# Patient Record
Sex: Male | Born: 1990 | Race: White | Hispanic: No | Marital: Married | Smoking: Never smoker
Health system: Southern US, Community
[De-identification: ages and names within clinical notes are randomized; demographics above are authoritative.]

## PROBLEM LIST (undated history)

## (undated) DIAGNOSIS — K4090 Unilateral inguinal hernia, without obstruction or gangrene, not specified as recurrent: Secondary | ICD-10-CM

## (undated) DIAGNOSIS — N433 Hydrocele, unspecified: Secondary | ICD-10-CM

## (undated) HISTORY — DX: Hydrocele, unspecified: N43.3

## (undated) HISTORY — DX: Unilateral inguinal hernia, without obstruction or gangrene, not specified as recurrent: K40.90

---
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
## 2013-03-01 IMAGING — CT CT HEAD WITHOUT CONTRAST
2 series · 16 of 30 positions shown, 20 images · non-contrast
Comparison: none

REASON FOR EXAM: syncope
COMMENTS:

PROCEDURE:     CT  - CT HEAD WITHOUT CONTRAST  - October 19, 2011  [DATE]
RESULT:     History: Syncope.
Comparison Study: No prior.

[Series 2: without · axial · non-contrast · 0.43mm/px · z∈[-176,-56]mm · 13 of 29 slices shown, 17 images]
[im 3/29  brain]
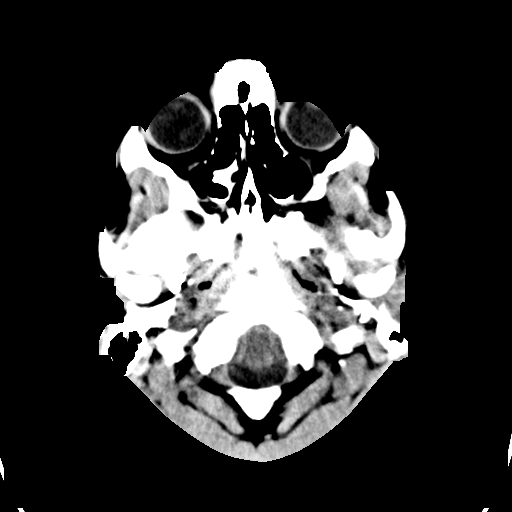
[im 3/29  bone]
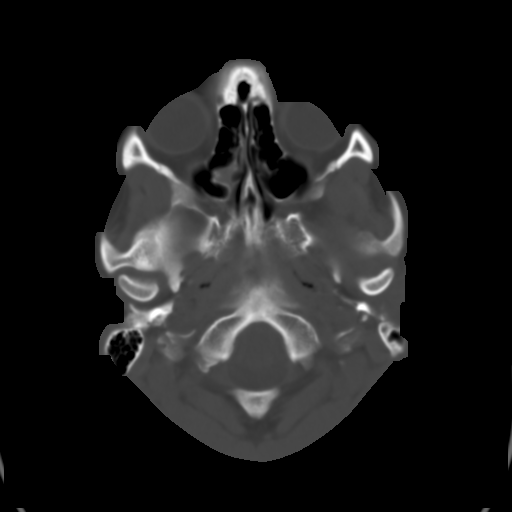
[im 5/29  brain]
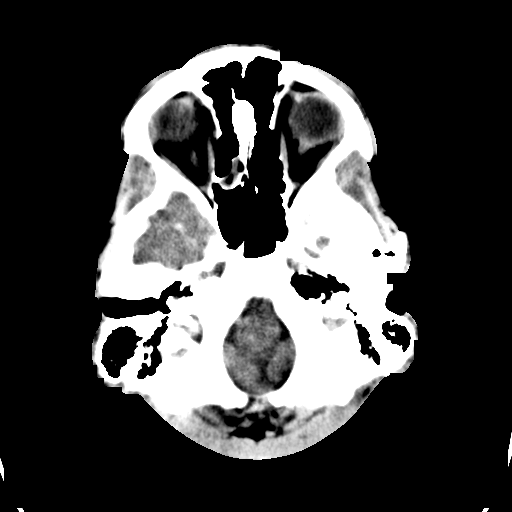
[im 7/29  brain]
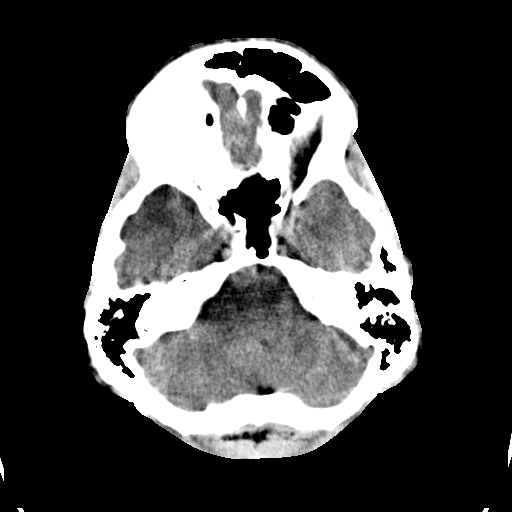
[im 9/29  brain]
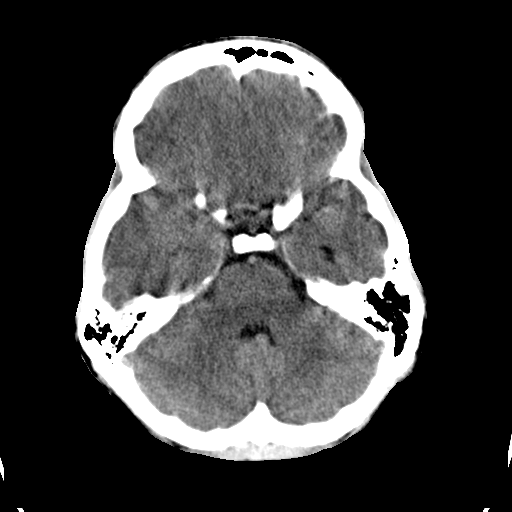
[im 11/29  brain]
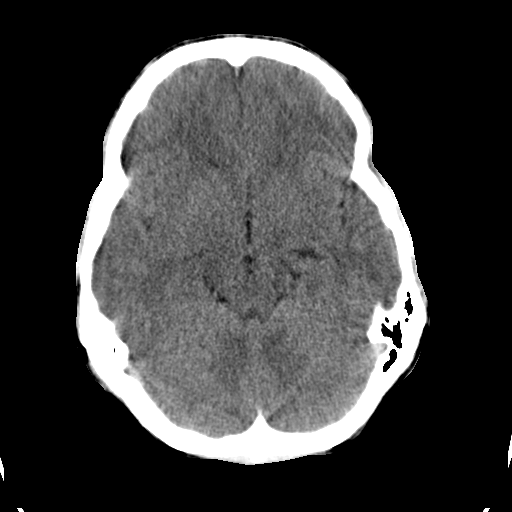
[im 11/29  bone]
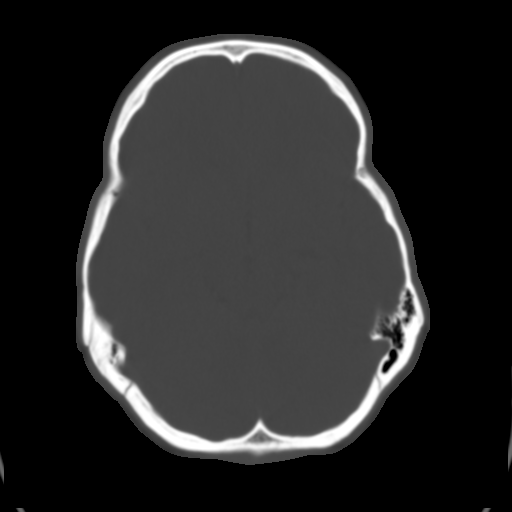
[im 13/29  brain]
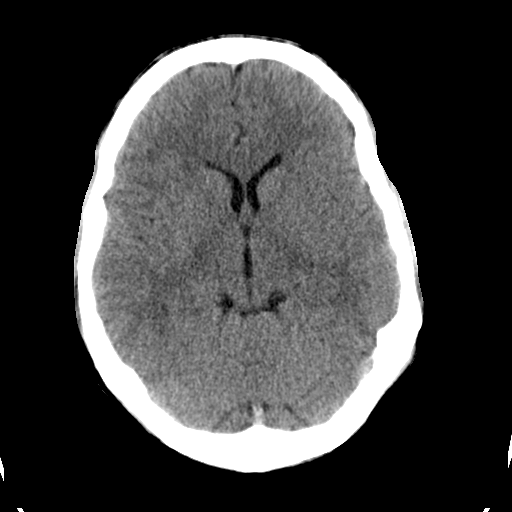
[im 15/29  brain]
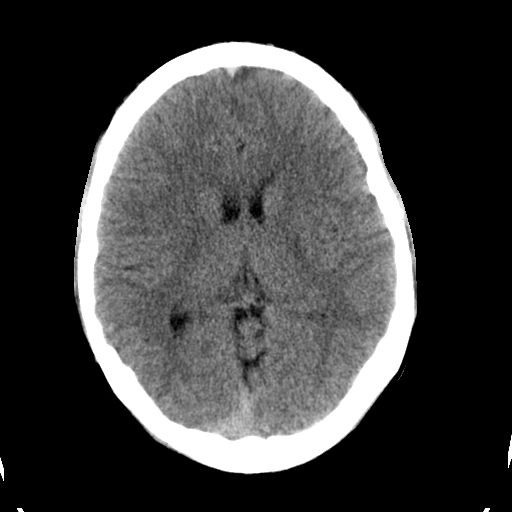
[im 17/29  brain]
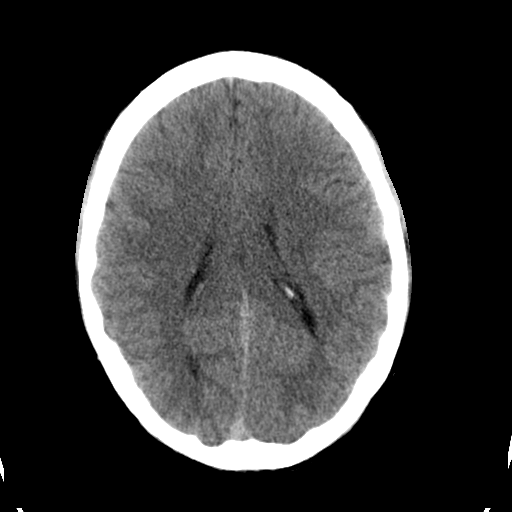
[im 19/29  brain]
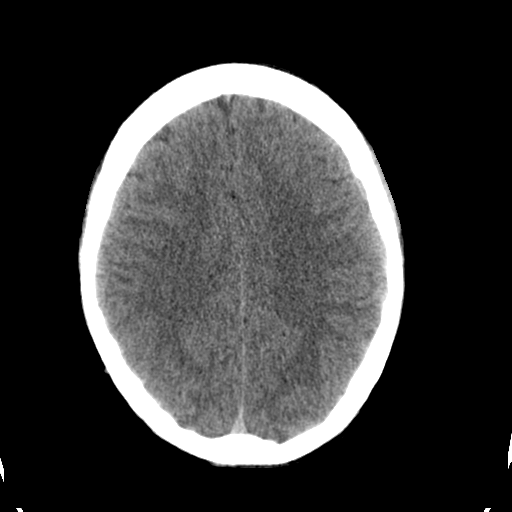
[im 19/29  bone]
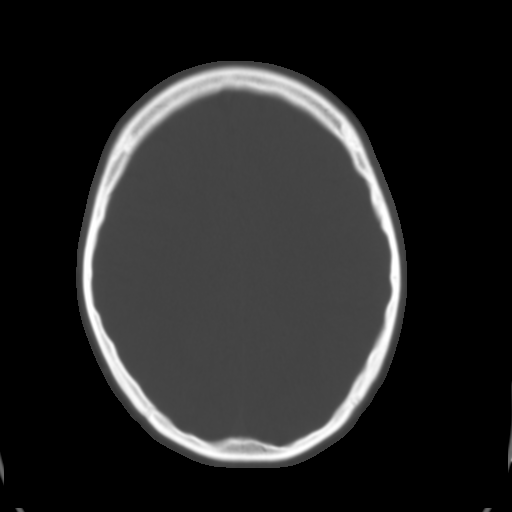
[im 21/29  brain]
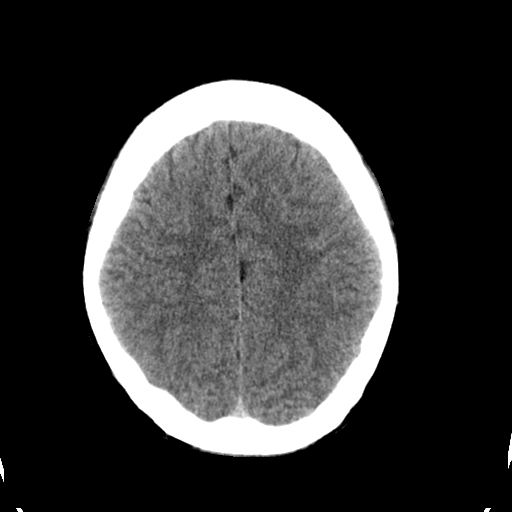
[im 23/29  brain]
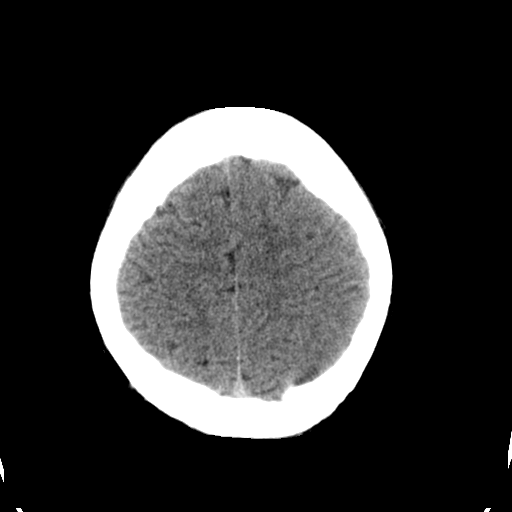
[im 25/29  brain]
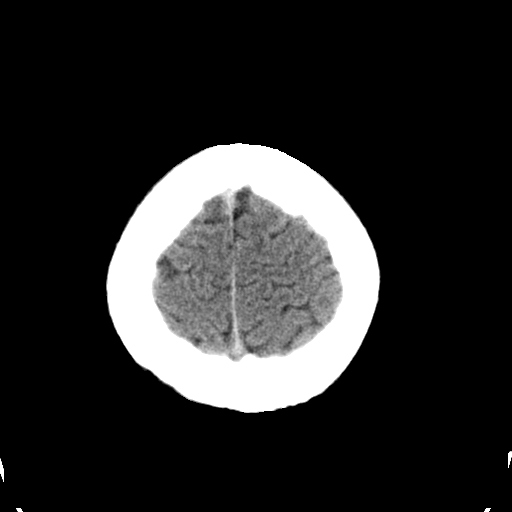
[im 27/29  brain]
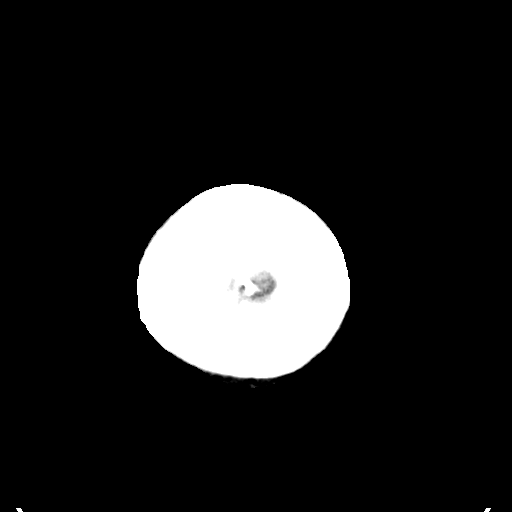
[im 27/29  bone]
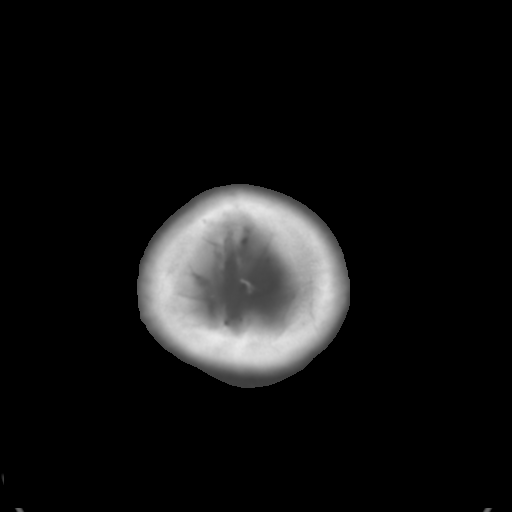

[Series 3: bone · axial · 0.43mm/px · z∈[-176,-136]mm · 3 of 29 slices shown]
[im 3/29  bone]
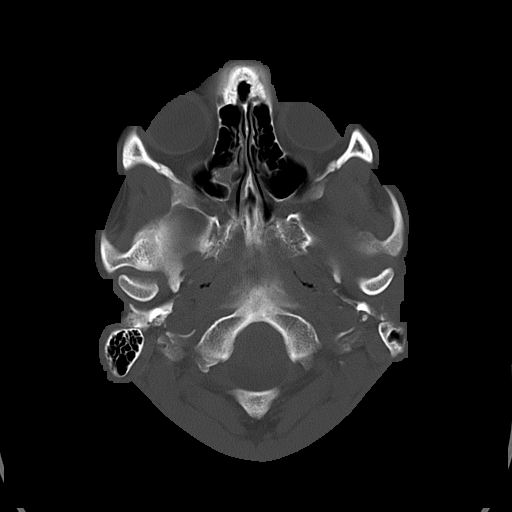
[im 7/29  bone]
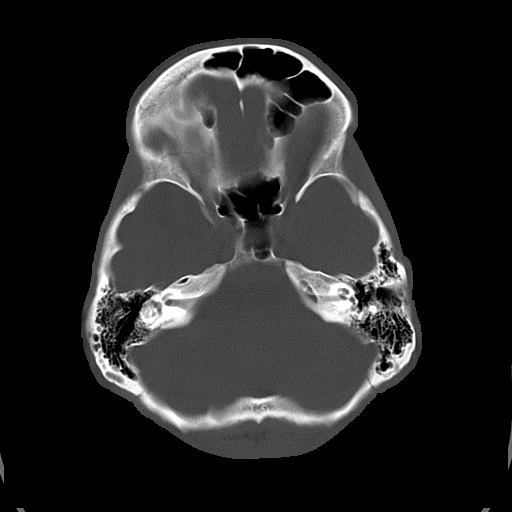
[im 11/29  bone]
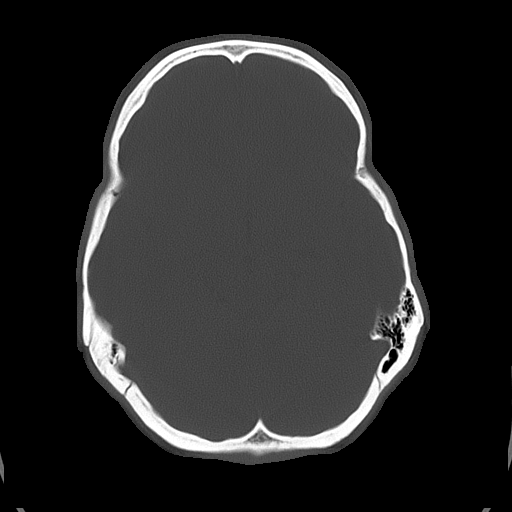

[16 of 30 positions shown; findings below may reference images not displayed]

FINDINGS: No mass. No hydrocephalus. No hemorrhage. And no acute bony
abnormality. There is mild thickening noted in the ethmoid sinuses.
IMPRESSION: No acute abnormality

## 2013-03-01 IMAGING — CR CERVICAL SPINE - COMPLETE 4+ VIEW
1 series · 7 of 7 positions shown · non-contrast
Comparison: None

REASON FOR EXAM: s/p fall
COMMENTS:

PROCEDURE:     DXR - DXR CERVICAL SPINE COMPLETE  - October 19, 2011  [DATE]
RESULT:     History: Cervical pain

[Series 1: w cervical spine lat · 0.14mm/px · 7 of 7 slices shown]
[im 1/7]
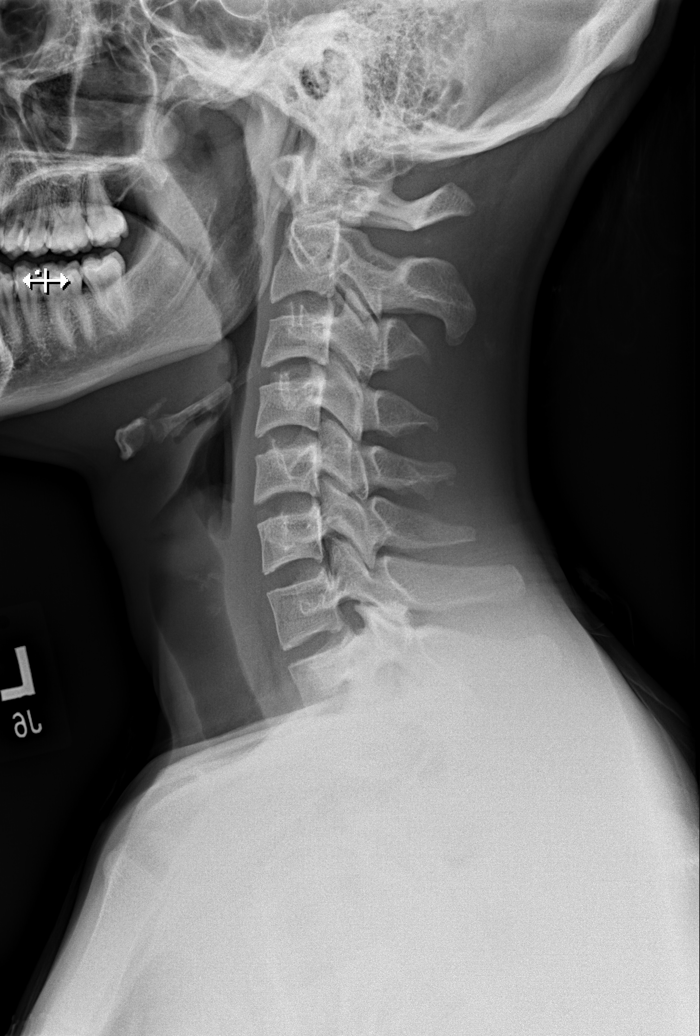
[im 2/7]
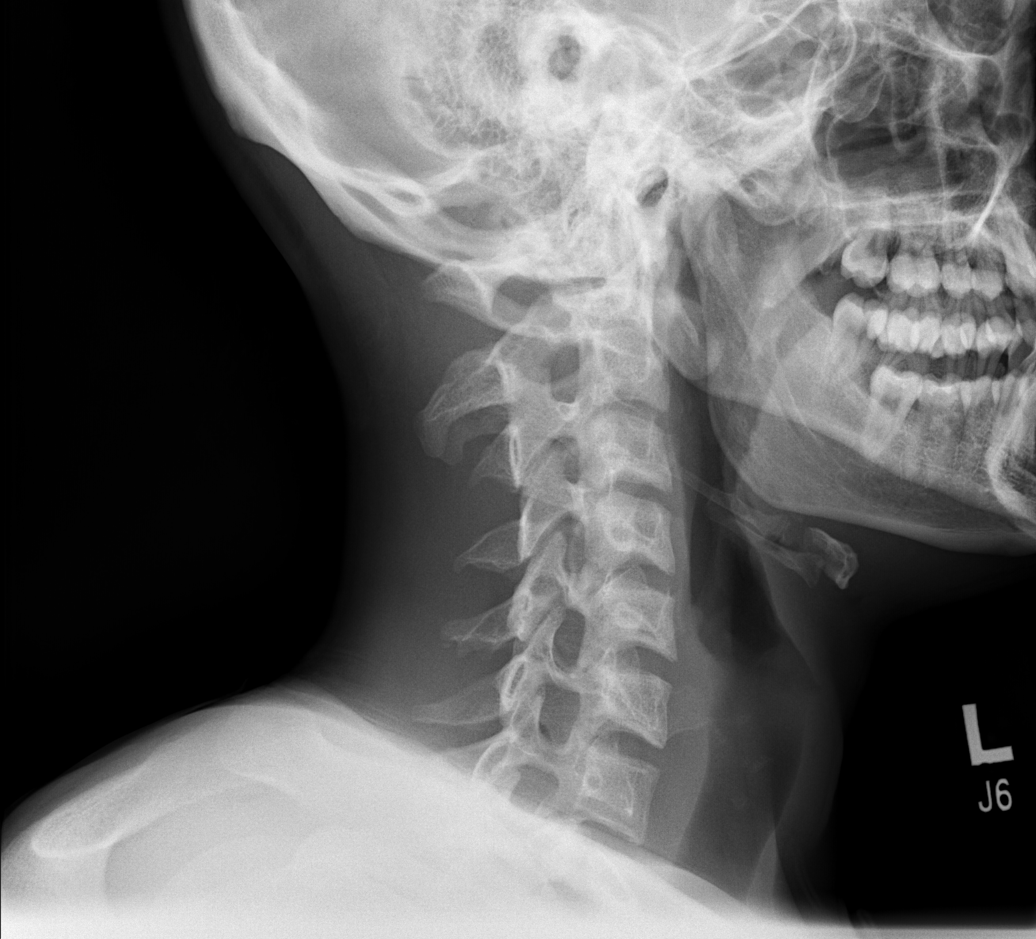
[im 3/7]
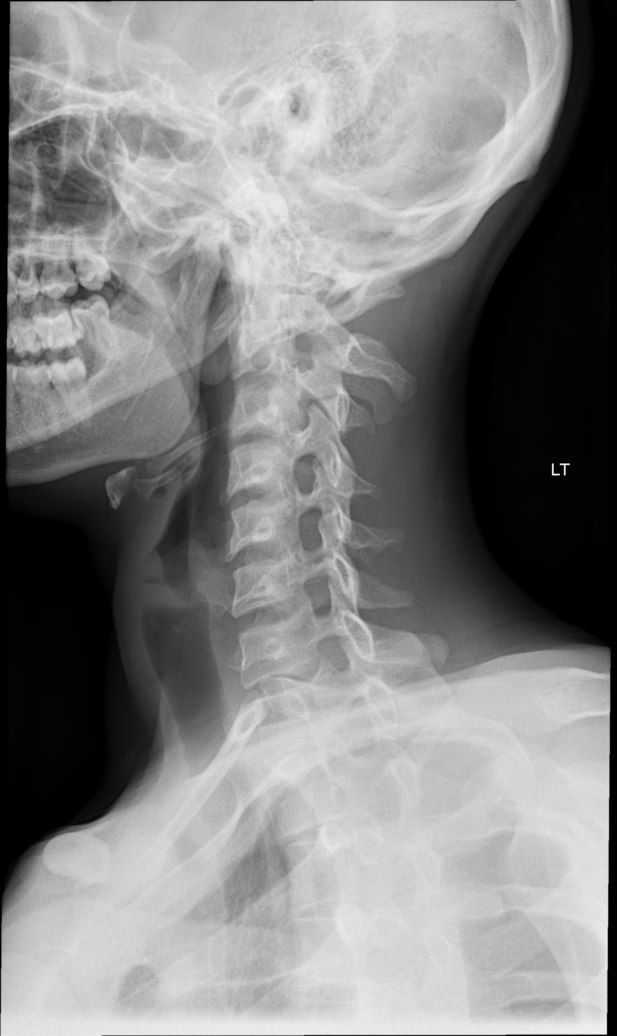
[im 4/7]
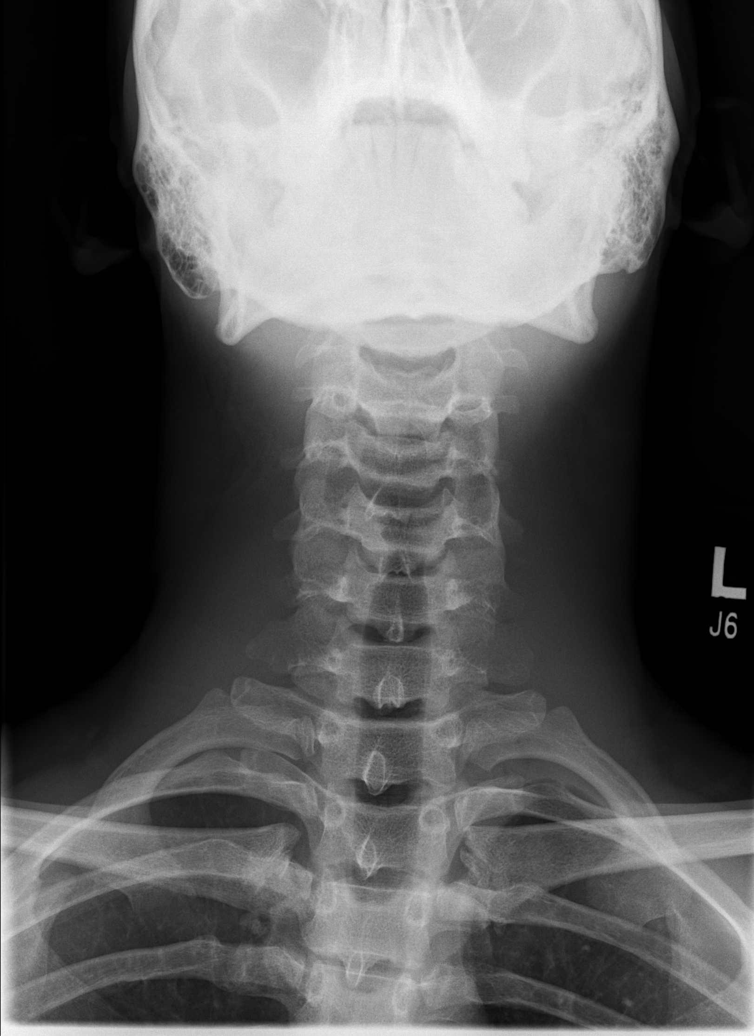
[im 5/7]
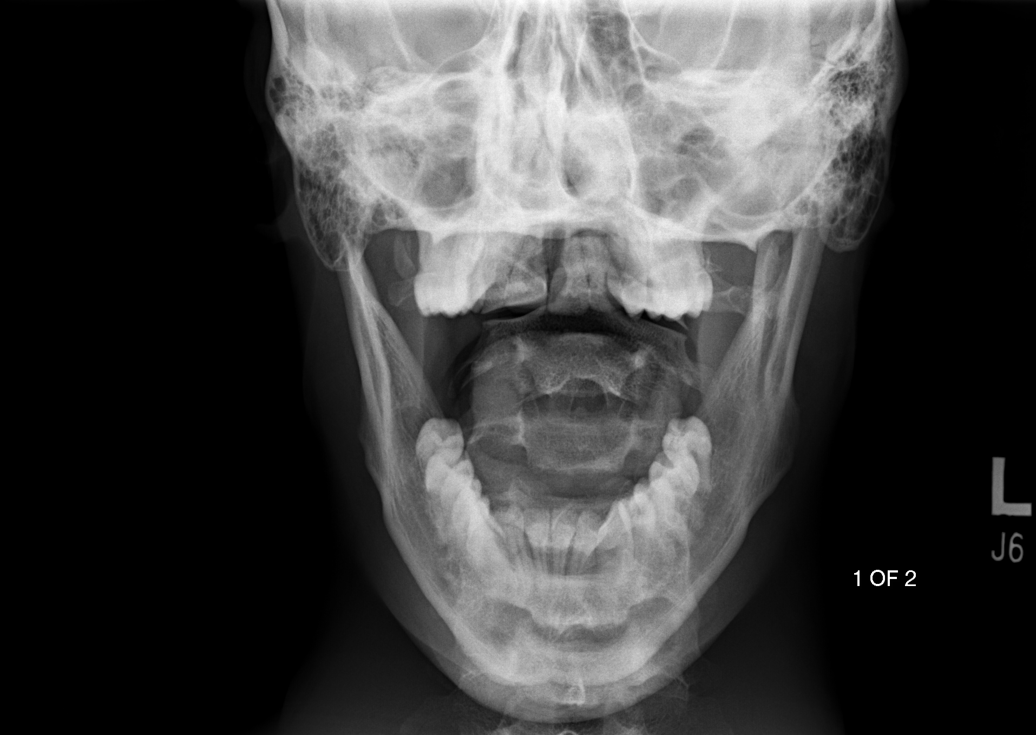
[im 6/7]
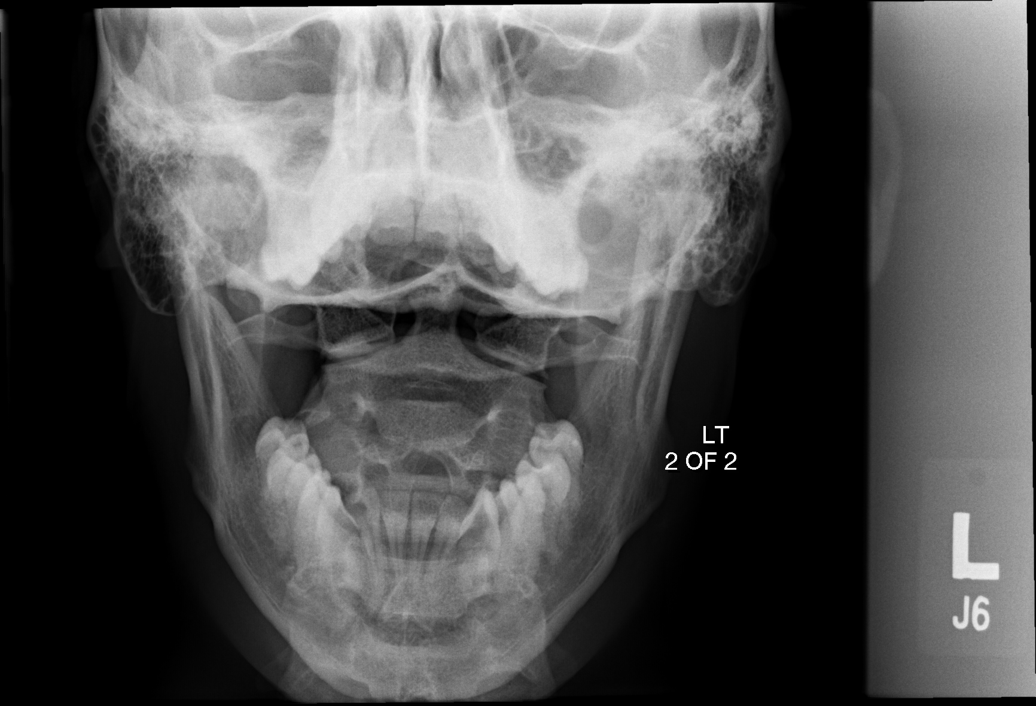
[im 7/7]
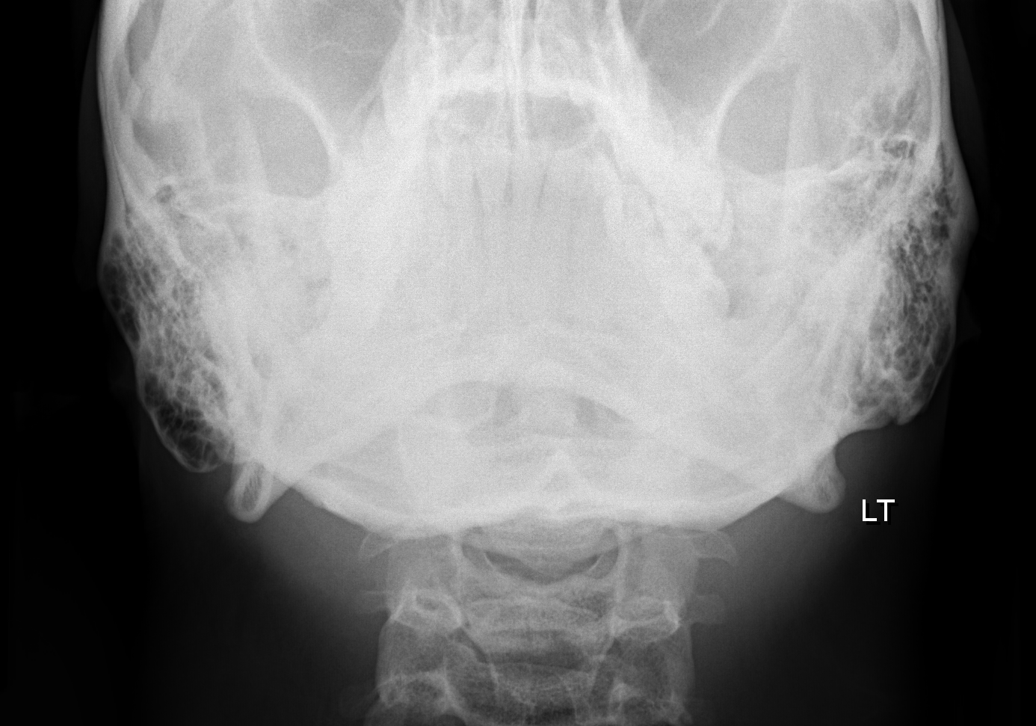

[7 of 7 positions shown; findings below may reference images not displayed]

FINDINGS: AP, lateral, bilateral oblique and odontoid views of the cervical spine are
provided.

The cervical spine is visualized to the level of T1.

The vertebral body heights are maintained. The alignment is normal. The
prevertebral soft tissues are normal. There is no acute fracture or static
listhesis. The disc spaces are maintained. Bilateral neural foramina are
patent.
IMPRESSION: No acute osseous injury of the cervical spine.

[REDACTED]

## 2017-04-08 ENCOUNTER — Encounter: Payer: Self-pay | Admitting: Urology

## 2017-04-08 ENCOUNTER — Ambulatory Visit: Payer: BC Managed Care – PPO | Admitting: Urology

## 2017-04-08 VITALS — BP 169/70 | HR 76 | Ht 71.0 in | Wt 160.0 lb

## 2017-04-08 DIAGNOSIS — N433 Hydrocele, unspecified: Secondary | ICD-10-CM | POA: Diagnosis not present

## 2017-04-08 NOTE — Progress Notes (Signed)
04/08/2017 6:09 PM   Ezequiel Kayser 15-Sep-1990 [id number]  Referring provider: No referring provider defined for this encounter.  Chief Complaint  Patient presents with  . Groin Swelling    New Patient    HPI: [name] is a 27 year old male who presents for evaluation of the right hemiscrotal swelling.  He underwent a right inguinal hernia repair at [facility] in 2009.  Shortly after that surgery he noted enlargement of his right hemiscrotum.  The size has been stable for the last 5 years.  He has mild discomfort on occasion when exercising, with certain changes in position and with tighter clothing.  There is no day-to-day size variation.  He has no voiding complaints.   PMH: Past Medical History:  Diagnosis Date  . Hydrocele   . Inguinal hernia     Surgical History: Past Surgical History:  Procedure Laterality Date  . INGUINAL HERNIA REPAIR Right 2009    Home Medications:  Allergies as of 04/08/2017   No Known Allergies     Medication List    as of 04/08/2017  6:09 PM   You have not been prescribed any medications.     Allergies: No Known Allergies  Family History: Family History  Problem Relation Age of Onset  . Bladder Cancer Neg Hx   . Prostate cancer Neg Hx   . Kidney cancer Neg Hx     Social History:  reports that  has never smoked. he has never used smokeless tobacco. He reports that he drinks alcohol. He reports that he does not use drugs.  ROS: UROLOGY Frequent Urination?: No Hard to postpone urination?: No Burning/pain with urination?: No Get up at night to urinate?: No Leakage of urine?: No Urine stream starts and stops?: No Trouble starting stream?: No Do you have to strain to urinate?: No Blood in urine?: No Urinary tract infection?: No Sexually transmitted disease?: No Injury to kidneys or bladder?: No Painful intercourse?: No Weak stream?: No Erection problems?: No Penile pain?: No  Gastrointestinal Nausea?: No Vomiting?:  No Indigestion/heartburn?: No Diarrhea?: No Constipation?: No  Constitutional Fever: No Night sweats?: No Weight loss?: No Fatigue?: No  Skin Skin rash/lesions?: No Itching?: No  Eyes Blurred vision?: No Double vision?: No  Ears/Nose/Throat Sore throat?: No Sinus problems?: No  Hematologic/Lymphatic Swollen glands?: No Easy bruising?: No  Cardiovascular Leg swelling?: No Chest pain?: No  Respiratory Cough?: No Shortness of breath?: No  Endocrine Excessive thirst?: No  Musculoskeletal Back pain?: No Joint pain?: No  Neurological Headaches?: No Dizziness?: No  Psychologic Depression?: No Anxiety?: No  Physical Exam: BP (!) 169/70   Pulse 76   Ht 5\' 11"  (1.803 m)   Wt 160 lb (72.6 kg)   BMI 22.32 kg/m   Constitutional:  Alert and oriented, No acute distress. HEENT: [place] AT, moist mucus membranes.  Trachea midline, no masses. Cardiovascular: No clubbing, cyanosis, or edema. Respiratory: Normal respiratory effort, no increased work of breathing. GI: Abdomen is soft, nontender, nondistended, no abdominal masses GU: No CVA tenderness.  Transilluminating right hemiscrotal mass measuring approximately 8 cm.  No tenderness.  The right testis is not palpable.  Left testis palpably normal.  Penis circumcised without lesions. Skin: No rashes, bruises or suspicious lesions. Lymph: No cervical or inguinal adenopathy. Neurologic: Grossly intact, no focal deficits, moving all 4 extremities. Psychiatric: Normal mood and affect.  Laboratory Data: Lab Results  Component Value Date   WBC 9.3 10/19/2011   HGB 15.8 10/19/2011   HCT 44.8 10/19/2011  MCV 91 10/19/2011   PLT 190 10/19/2011    Lab Results  Component Value Date   CREATININE 1.08 10/19/2011     Assessment & Plan:    1. Hydrocele, unspecified hydrocele type Management options were discussed including observation, hydrocelectomy and aspiration/sclerotherapy.  He has elected observation for now.   Since his right testis is not palpable have recommended a scrotal ultrasound which will be scheduled.  He will be contacted with the results and may follow-up as needed for any significant change in size or development of symptoms.  - US Scrotum; Future    Riki AltesScott C Lister Brizzi, MD  [facility] [address], [address] [place], [place] [address] [phone]

## 2023-06-24 NOTE — Progress Notes (Signed)
 Virtual Primary Care: Outpatient visit note   Patient location at the time of the visit: [place]   I have verified the patient's location, and I am licensed to practice in that state. Audio and video technology were used to conduct this virtual visit. Patient (or parent/guardian as applicable) consented to virtual care.   Patient is: not a minor   Subjective:    Jordan Moreno is a 33 y.o. male here for an acute sick  patient visit.   Here today for evaluation of sinus pressure/pain and congestion.  Patient reports symptoms started about 2 weeks ago and has continued to worsen with increasing sinus pressure/pain, yellow mucous, tooth pain and headache.  He notes he has taken Claritin and Benadryl OTC but with minimal response.  He does report cough and sore throat last week as well.    Review of Systems  Constitutional:  Positive for fatigue. Negative for chills and fever.  HENT:  Positive for congestion, postnasal drip, rhinorrhea, sinus pressure and sinus pain. Negative for ear pain and sore throat.   Respiratory:  Negative for cough, chest tightness, shortness of breath and wheezing.   Cardiovascular:  Negative for chest pain and palpitations.  Neurological:  Positive for headaches.    Current Outpatient Medications  Medication Sig Dispense Refill  . amoxicillin (AMOXIL) 875 MG tablet Take 1 tablet (875 mg total) by mouth 2 (two) times a day for 10 days 20 tablet 0   No current facility-administered medications for this visit.    No Known Allergies  History reviewed. No pertinent past medical history.  History reviewed. No pertinent family history.  Past Surgical History:  Procedure Laterality Date  . HERNIA REPAIR      Social History   Socioeconomic History  . Marital status: Not on file    Spouse name: Not on file  . Number of children: Not on file  . Years of education: Not on file  . Highest education level: Not on file  Occupational History  . Not on file   Tobacco Use  . Smoking status: Never  . Smokeless tobacco: Never  Substance and Sexual Activity  . Alcohol use: Not on file  . Drug use: Not on file  . Sexual activity: Not on file  Other Topics Concern  . Not on file  Social History Narrative  . Not on file   Social Drivers of Health   Financial Resource Strain: Not on file  Food Insecurity: Not on file  Transportation Needs: Not on file  Physical Activity: Not on file  Stress: Not on file  Social Connections: Not on file  Intimate Partner Violence: Not on file  Housing Stability: Not on file    Objective:    Physical exam: VS: Ht 5' 11 (1.803 m)   Wt 165 lb (74.8 kg)   BMI 23.01 kg/m  No LMP for male patient. All vital signs are self-reported by the patient.  Physical Exam Vitals and nursing note reviewed.  Constitutional:      General: He is not in acute distress.    Appearance: Normal appearance. He is ill-appearing. He is not toxic-appearing or diaphoretic.  HENT:     Nose: Nasal tenderness and congestion present.     Right Sinus: Maxillary sinus tenderness and frontal sinus tenderness present.     Left Sinus: Maxillary sinus tenderness and frontal sinus tenderness present.     Mouth/Throat:     Lips: Pink.  Pulmonary:     Effort: Pulmonary  effort is normal. No respiratory distress.     Breath sounds: No wheezing.  Neurological:     General: No focal deficit present.     Mental Status: He is alert and oriented to person, place, and time.     Cranial Nerves: No cranial nerve deficit.  Psychiatric:        Mood and Affect: Mood normal.        Behavior: Behavior normal. Behavior is cooperative.        Thought Content: Thought content normal.        Judgment: Judgment normal.      Assessment & Plan:   Diagnoses and all orders for this visit:  Acute non-recurrent pansinusitis -     amoxicillin (AMOXIL) 875 MG tablet; Take 1 tablet (875 mg total) by mouth 2 (two) times a day for 10 days    Assessments 1 Plan Comments:   See HPI notes, Clinical findings evident for acute bacterial pansinusitis. The patient is a good candidate for outpatient management with oral antibiotic and symptomatic therapy based on general clinical virtual appearance and history. Will start amoxicillin BID x 10 days due to ongoing/worsening symptoms >10 days and history of double sickening with symptom progression.   Reviewed medications, directions and potential s/e.  Will follow up in 7-10 days if no improvement or earlier if any new/worsening symptoms occur despite treatment. PMH, home medications, and allergies reviewed and confirmed with the patient.   Visit was completed virtually with camera appropriately placed on patient during entire visit with no interruptions.   Patient performed self-exam with provider guided assistance. Patient's HPI was provided by patient's self. Patient expressed understanding with care plan and follow-up instructions.      Health Maintenance Due  Topic Date Due  . Depression: Screening Annually using PHQ-2/9 in Adults 18 yrs or above (or HM Modifier)([facility])  Never done  . Hepatitis C Virus Infection in Adolescents and Adults: Screening (or Modifier) ([facility])  Never done  . SDOH Screening Reminder: Annually for all adults ([facility])  Never done  . DTaP/Tdap/Td Vaccines ([facility]) (1 - Tdap) Never done  . Lipid Screening: Once for Men aged 79 to 35 yrs ([facility])  Never done  . COVID-19 Vaccine Screening: Initial Series and Booster Status ([facility]) (1 - 2024-25 season) Never done    Return if symptoms worsen or fail to improve.  The patient has consented to the release of test result information via telephone voicemail or electronic patient portal/personal health record.  -- Mikaela B. [facility] 06/24/2023  12:33 PM

## 2024-01-26 ENCOUNTER — Ambulatory Visit: Admission: EM | Admit: 2024-01-26 | Discharge: 2024-01-26 | Disposition: A | Discharge status: Home / Self Care

## 2024-01-26 ENCOUNTER — Encounter: Payer: Self-pay | Admitting: *Deleted

## 2024-01-26 ENCOUNTER — Other Ambulatory Visit: Payer: Self-pay

## 2024-01-26 ENCOUNTER — Emergency Department: Admission: EM | Admit: 2024-01-26 | Discharge: 2024-01-26 | Disposition: A | Discharge status: Home / Self Care

## 2024-01-26 DIAGNOSIS — S61012A Laceration without foreign body of left thumb without damage to nail, initial encounter: Secondary | ICD-10-CM

## 2024-01-26 DIAGNOSIS — W260XXA Contact with knife, initial encounter: Secondary | ICD-10-CM | POA: Diagnosis not present

## 2024-01-26 DIAGNOSIS — M6789 Other specified disorders of synovium and tendon, multiple sites: Secondary | ICD-10-CM

## 2024-01-26 MED ORDER — CEPHALEXIN 500 MG PO CAPS
500.0000 mg | ORAL_CAPSULE | Freq: Once | ORAL | Status: AC
  Administered 2024-01-26: 500 mg via ORAL
  Filled 2024-01-26: qty 1

## 2024-01-26 MED ORDER — LIDOCAINE-EPINEPHRINE-TETRACAINE (LET) TOPICAL GEL
3.0000 mL | Freq: Once | TOPICAL | Status: AC
  Administered 2024-01-26: 3 mL via TOPICAL
  Filled 2024-01-26: qty 3

## 2024-01-26 MED ORDER — CEPHALEXIN 500 MG PO CAPS
500.0000 mg | ORAL_CAPSULE | Freq: Four times a day (QID) | ORAL | 0 refills | Status: AC
Start: 2024-01-26 — End: 2024-01-31

## 2024-01-26 NOTE — ED Triage Notes (Addendum)
 Laceration to top of left thumb that occurred today with a knife at work. Unable to extend thumb.   Advised by provider Burnard Cork NP that patient needs to be seen in ER.   Wound wrapped/ dressing applied.

## 2024-01-26 NOTE — Consult Note (Signed)
 [place] [facility]  CONSULTATION NOTE  Patient Name: Davione Lenker DOB: Jul 15, 1990 CSN: [id number] Primary Physician: Patient, No Pcp Per   Reason for Consult: Left thumb laceration  HPI: Alexsandro Salek is a 33 y.o. right hand dominant male who presents with left thumb laceration which occurred earlier this evening.  Patient reports he was working with a putty knife when it slipped, causing a wound over his dorsal left thumb.  He noted immediate bleeding to the laceration site.  He attempted to hold pressure over the wound but due to the appearance of the injury site, decided to present to the emergency department for further evaluation.  In the ED, there was concern for extensor tendon involvement of the left thumb and therefore orthopedics was consulted for further evaluation.  Currently, patient admits to mild pain over the dorsal left thumb.  He does admit to some mild numbness over the pulp of his thumb.  He notes inability to fully extend his thumb, not limited by pain.  Denies other symptoms at this time.  No other complaints.  Patient is accompanied by his father this evening.  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       Past Medical History:  Diagnosis Date   Hydrocele    Inguinal hernia       Past Surgical History:  Procedure Laterality Date   INGUINAL HERNIA REPAIR Right 2009      No current facility-administered medications on file prior to encounter.   No current outpatient medications on file prior to encounter.    Review of systems: A review of systems was performed and is negative unless otherwise noted in the HPI    Family History  Problem Relation Age of Onset   Bladder Cancer Neg Hx    Prostate cancer Neg Hx    Kidney cancer Neg Hx       Social History   Socioeconomic History   Marital status: Single    Spouse name: Not on file   Number of children: Not on file   Years of education: Not on file    Highest education level: Not on file  Occupational History   Not on file  Tobacco Use   Smoking status: Never   Smokeless tobacco: Never  Substance and Sexual Activity   Alcohol use: Not Currently   Drug use: No   Sexual activity: Not on file  Other Topics Concern   Not on file  Social History Narrative   Not on file   Social Drivers of Health   Financial Resource Strain: Not on file  Food Insecurity: Not on file  Transportation Needs: Not on file  Physical Activity: Not on file  Stress: Not on file  Social Connections: Not on file  Intimate Partner Violence: Not on file      Vitals:   01/26/24 1838  BP: 137/88  Pulse: 71  Resp: 18  Temp: 97.9 F (36.6 C)  SpO2: 100%    Physical Exam: GEN: NAD, AOx3  Left hand/thumb exam: 2 cm transverse laceration noted over the dorsal proximal phalanx of left thumb.  No active bleeding present.  Small skin flap covering dorsal wound.  Skin flap elevated revealing underlying muscle belly.  Unable to visualize or palpate extensor tendon distal to the laceration site.  Tenderness to palpation overlying the laceration site and just distal near the IP joint of the thumb.  Patient able to actively flex and extend at  the MCP joint of the thumb.  Patient with active flexion at the IP joint of the thumb, weakness and inability to achieve full extension at the IP joint.  Passively, able to achieve endrange extension at the IP joint. Patient able to actively retrovert thumb. Capillary refill intact about the left thumb distally.  Sensation intact about the radial and ulnar digital nerve distributions of the thumb.  No obvious signs of trauma noted otherwise on the left hand.   IMAGING:  No imaging available for review  ASSESSMENT: Concern for left thumb extensor tendon laceration  PLAN:   Patient was seen and examined in the emergency department this evening.  Orthopedics consulted for concern over left thumb extensor tendon laceration.   Clinically, there is a transverse laceration over the thumb proximal phalanx.  Unable to palpate intact extensor tendon distal to this laceration site.  Patient also with deficits in thumb IP joint extension.  Given the location of the injury and motion deficit, there is concern for left thumb extensor tendon laceration.  Discussed importance of further investigation by hand specialist.  I have recommended referral to Dr. Ezra for close follow-up to evaluate for possible further intervention.  Patient will call his office tomorrow for follow-up.  The wound site was thoroughly irrigated and closed with skin sutures by emergency room personnel.  He was placed on antibiotics prophylactically.  All questions and concerns by patient and his father were addressed at bedside.  Elevation of the hand and maintenance of soft dressing until follow-up.  Thank you for the consultation in the coordinated care of this patient. Please call with any questions or concerns.  Arlyss GEANNIE Schneider, DO Orthopedic Surgery & Sports Medicine [place]  10:34 PM 01/26/24

## 2024-01-26 NOTE — ED Provider Notes (Signed)
 Patient presents this evening with a laceration on the dorsal side of his left thumb.  This occurred just prior to arrival when he was at work and dropped a putty knife on his thumb.  He is unable to straighten his thumb.  Discussed possibility of tendon damage.  Sending him to the ED for evaluation.  He is agreeable to this and will go to [facility] ED.   Corlis Burnard DEL, NP 01/26/24 [place]

## 2024-01-26 NOTE — ED Provider Notes (Signed)
 [facility] Provider Note    None    (approximate)   History   Laceration   HPI  Jordan Moreno is a 33 y.o. male  with a past medical history of hydrocele, inguinal repair presents to the emergency department with left thumb laceration on the dorsal aspect after cutting his thumb with a putty knife earlier today around 5 to 6 PM.  Bleeding initially occurred, but is controlled at this time.  Patient was sent from urgent care for evaluation due to possible tendon damage with inability to fully straighten his thumb.  Patient reports the area feels tingly from pain.  No allergies.  Patient works as a nutritional therapist, right hand dominant.  Most recent tetanus shot was within the last 1 to 2 years.   Physical Exam   Triage Vital Signs: ED Triage Vitals [01/26/24 1838]  Encounter Vitals Group     BP 137/88     Girls Systolic BP Percentile      Girls Diastolic BP Percentile      Boys Systolic BP Percentile      Boys Diastolic BP Percentile      Pulse Rate 71     Resp 18     Temp 97.9 F (36.6 C)     Temp Source Oral     SpO2 100 %     Weight 160 lb (72.6 kg)     Height 5' 11 (1.803 m)     Head Circumference      Peak Flow      Pain Score 7     Pain Loc      Pain Education      Exclude from Growth Chart     Most recent vital signs: Vitals:   01/26/24 1838  BP: 137/88  Pulse: 71  Resp: 18  Temp: 97.9 F (36.6 C)  SpO2: 100%    General: Awake, in no acute distress. Appears stated age. Neck: Supple. CV: Good peripheral perfusion. Radial pulses 2+ b/l. Cap refill <2 sec b/l thumbs. Respiratory:Normal respiratory effort.  No respiratory distress.  GI: Soft, non-distended, non-tender.  MSK: Full ROM with active flexion and extension of the MCP joint of the left thumb. Full active  flexion to the IP joint of the left thumb with some but not full active extension of the IP joint. Able to passively flex and extend both the IP and MCP joints. Skin:Warm,  dry. 2 cm curved laceration to dorsal aspect of left thumb at the proximal phalanx, bleeding controlled. TTP along the lacerated area. Extensor tendon not visualized. Neurological: A&Ox4 to person, place, time, and situation. Sensation intact to b/l median, radial and ulnar nerves. Strength symmetric. No focal deficits.   ED Results / Procedures / Treatments   Labs (all labs ordered are listed, but only abnormal results are displayed) Labs Reviewed - No data to display   EKG     RADIOLOGY    PROCEDURES:  Critical Care performed: No   .Laceration Repair  Date/Time: 01/26/2024 11:17 PM  Performed by: Sheron Salm, PA-C Authorized by: Sheron Salm, PA-C   Consent:    Consent obtained:  Verbal   Consent given by:  Patient   Risks, benefits, and alternatives were discussed: yes     Risks discussed:  Infection, need for additional repair, nerve damage, poor wound healing, poor cosmetic result, pain, retained foreign body, tendon damage and vascular damage Universal protocol:    Procedure explained and questions answered to patient or proxy's  satisfaction: yes     Immediately prior to procedure, a time out was called: yes     Patient identity confirmed:  Verbally with patient Anesthesia:    Anesthesia method:  Topical application   Topical anesthetic:  LET Laceration details:    Location:  Finger   Finger location:  L thumb   Length (cm):  2 Pre-procedure details:    Preparation:  Patient was prepped and draped in usual sterile fashion Exploration:    Hemostasis achieved with:  LET   Wound exploration: wound explored through full range of motion     Wound extent: tendon damage     Wound extent: no foreign body, no signs of injury, no nerve damage, no underlying fracture and no vascular damage     Tendon damage location:  Upper extremity   Upper extremity tendon damage location:  Finger extensor   Tendon damage extent:  Partial transection   Tendon repair plan:   Refer for evaluation   Contaminated: no   Treatment:    Area cleansed with:  Povidone-iodine   Amount of cleaning:  Standard   Irrigation solution:  Sterile saline   Irrigation volume:  50 mL   Irrigation method:  Pressure wash Skin repair:    Repair method:  Sutures   Suture size:  5-0   Suture material:  Nylon   Suture technique:  Simple interrupted   Number of sutures:  4 Approximation:    Approximation:  Close Repair type:    Repair type:  Simple Post-procedure details:    Dressing:  Sterile dressing   Procedure completion:  Tolerated well, no immediate complications     MEDICATIONS ORDERED IN ED: Medications  lidocaine-EPINEPHrine-tetracaine (LET) topical gel (3 mLs Topical Given 01/26/24 2102)  cephALEXin (KEFLEX) capsule 500 mg (500 mg Oral Given 01/26/24 2218)     IMPRESSION / MDM / ASSESSMENT AND PLAN / ED COURSE  I reviewed the triage vital signs and the nursing notes.                              Differential diagnosis includes, but is not limited to, left thumb laceration, thumb extensor tendon injury, nerve injury, abrasion, cellulitis  Patient's presentation is most consistent with acute, uncomplicated illness.  Patient presented to the emergency department for left thumb laceration with possible extensor tendon involvement from urgent care.  Physical exam as noted above with limited active extension of the left IP joint of the thumb.  Orthopedics was consulted (Dr. Arlyss Schneider) with concern for tendon injury.  Patient is neurovascularly intact.  Dr. Arlyss has concern for left thumb extensor tendon laceration.  He recommended follow-up with hand specialist, Dr. Jackquline Barrack outpatient.  Referral was provided and they were told to call his office tomorrow to schedule an appointment.  Please see procedure note for full details regarding laceration repair with 4 simple interrupted nylon 5-0 sutures after application of LET for anesthesia.  Wound was sterilely  dressed and wound care instructions were discussed with the patient.  He will need to follow-up in 7 to 10 days for suture removal either in primary care, urgent care or the emergency department.  Also given referral to establish care with primary care, given that the patient does not have a PCP.  1 dose of Keflex provided in the ER and prescription sent to pharmacy.  The patient may return to the emergency department for any new, worsening, or concerning  symptoms. Patient was given the opportunity to ask questions; all questions were answered. Emergency department return precautions were discussed with the patient.  Patient is in agreement to the treatment plan.  Patient is stable for discharge.    FINAL CLINICAL IMPRESSION(S) / ED DIAGNOSES   Final diagnoses:  Laceration of left thumb without foreign body without damage to nail, initial encounter  Extensor tendon disruption     Rx / DC Orders   ED Discharge Orders          Ordered    Ambulatory Referral to Primary Care (Establish Care)        01/26/24 2203    cephALEXin (KEFLEX) 500 MG capsule  4 times daily        01/26/24 2213             Note:  This document was prepared using Dragon voice recognition software and may include unintentional dictation errors.     Sheron Salm, PA-C 01/27/24 0031    Clarine Ozell LABOR, MD 01/29/24 [phone]

## 2024-01-26 NOTE — Discharge Instructions (Addendum)
 You have been seen in the Emergency Department (ED) today for a laceration (cut).  Please keep the cut clean but do not submerge it in water.  It has been repaired with staples or sutures that will need to be removed in about 7-10 days. Please follow up with your doctor, an urgent care, or return to the ED for suture removal.    Please take Tylenol (acetaminophen) or Motrin (ibuprofen) as needed for discomfort as written on the box.   Please follow up with your doctor as soon as possible regarding today's emergent visit. If you do not have a doctor, I have given you a referral to primary care to establish care.  Please read through the attached resources and give one of their offices a call at your earliest convenience to establish care.  Please also follow-up with the orthopedist listed for evaluation of potential injury of your extensor tendon.  Return to the ED or call your doctor if you notice any signs of infection such as fever, increased pain, increased redness, pus, or other symptoms that concern you.

## 2024-01-26 NOTE — ED Provider Notes (Incomplete)
 [facility] Provider Note    None    (approximate)   History   Laceration   HPI  Jordan Moreno is a 33 y.o. male  with a past medical history of hydrocele, inguinal repair presents to the emergency department with left thumb laceration on the dorsal aspect after cutting his thumb with a putty knife earlier today around 5 to 6 PM.  Bleeding controlled at this time.  Patient was sent from urgent care for evaluation due to possible tendon damage with inability to fully straighten his thumb.  Patient reports the area feels tingly from pain.  Denies numbness.  No allergies.  Patient works as a nutritional therapist.  Most recent tetanus shot was within the last 1 to 2 years.   Physical Exam   Triage Vital Signs: ED Triage Vitals [01/26/24 1838]  Encounter Vitals Group     BP 137/88     Girls Systolic BP Percentile      Girls Diastolic BP Percentile      Boys Systolic BP Percentile      Boys Diastolic BP Percentile      Pulse Rate 71     Resp 18     Temp 97.9 F (36.6 C)     Temp Source Oral     SpO2 100 %     Weight 160 lb (72.6 kg)     Height 5' 11 (1.803 m)     Head Circumference      Peak Flow      Pain Score 7     Pain Loc      Pain Education      Exclude from Growth Chart     Most recent vital signs: Vitals:   01/26/24 1838  BP: 137/88  Pulse: 71  Resp: 18  Temp: 97.9 F (36.6 C)  SpO2: 100%    General: Awake, in no acute distress. Appears stated age. Neck: Supple. CV: Good peripheral perfusion. Radial pulses 2+ b/l. Respiratory:Normal respiratory effort.  No respiratory distress.  GI: Soft, non-distended, non-tender.  MSK: Normal ROM and  5/5 strength in *** extremities.  Skin:Warm, dry. 2 cm curved laceration to dorsal aspect of left thumb, bleeding controlled. Neurological: A&Ox4 to person, place, time, and situation. Sensation intact. Strength symmetric. No focal deficits.   ED Results / Procedures / Treatments   Labs (all labs  ordered are listed, but only abnormal results are displayed) Labs Reviewed - No data to display   EKG     RADIOLOGY    PROCEDURES:  Critical Care performed: No   .Laceration Repair  Date/Time: 01/26/2024 11:17 PM  Performed by: Sheron Salm, PA-C Authorized by: Sheron Salm, PA-C   Consent:    Consent obtained:  Verbal   Consent given by:  Patient   Risks, benefits, and alternatives were discussed: yes     Risks discussed:  Infection, need for additional repair, nerve damage, poor wound healing, poor cosmetic result, pain, retained foreign body, tendon damage and vascular damage Universal protocol:    Procedure explained and questions answered to patient or proxy's satisfaction: yes     Immediately prior to procedure, a time out was called: yes     Patient identity confirmed:  Verbally with patient Anesthesia:    Anesthesia method:  Topical application   Topical anesthetic:  LET Laceration details:    Location:  Finger   Finger location:  L thumb   Length (cm):  2 Pre-procedure details:    Preparation:  Patient was prepped and draped in usual sterile fashion Exploration:    Hemostasis achieved with:  LET   Wound exploration: wound explored through full range of motion     Wound extent: tendon damage     Wound extent: no foreign body, no signs of injury, no nerve damage, no underlying fracture and no vascular damage     Tendon damage location:  Upper extremity   Upper extremity tendon damage location:  Finger extensor   Tendon damage extent:  Partial transection   Tendon repair plan:  Refer for evaluation   Contaminated: no   Treatment:    Area cleansed with:  Povidone-iodine   Amount of cleaning:  Standard   Irrigation solution:  Sterile saline   Irrigation volume:  50 mL   Irrigation method:  Pressure wash Skin repair:    Repair method:  Sutures   Suture size:  5-0   Suture material:  Nylon   Suture technique:  Simple interrupted   Number of sutures:   4 Approximation:    Approximation:  Close Repair type:    Repair type:  Simple Post-procedure details:    Dressing:  Sterile dressing   Procedure completion:  Tolerated well, no immediate complications     MEDICATIONS ORDERED IN ED: Medications  lidocaine-EPINEPHrine-tetracaine (LET) topical gel (3 mLs Topical Given 01/26/24 2102)  cephALEXin (KEFLEX) capsule 500 mg (500 mg Oral Given 01/26/24 2218)     IMPRESSION / MDM / ASSESSMENT AND PLAN / ED COURSE  I reviewed the triage vital signs and the nursing notes.                              Differential diagnosis includes, but is not limited to, left thumb laceration, thumb extensor tendon injury, nerve injury, abrasion, cellulitis  Patient's presentation is most consistent with acute, uncomplicated illness.  ***  The patient may return to the emergency department for any new, worsening, or concerning symptoms. Patient was given the opportunity to ask questions; all questions were answered. Emergency department return precautions were discussed with the patient.  Patient is in agreement to the treatment plan.  Patient is stable for discharge.    FINAL CLINICAL IMPRESSION(S) / ED DIAGNOSES   Final diagnoses:  Laceration of left thumb without foreign body without damage to nail, initial encounter  Extensor tendon disruption     Rx / DC Orders   ED Discharge Orders          Ordered    Ambulatory Referral to Primary Care (Establish Care)        01/26/24 2203    cephALEXin (KEFLEX) 500 MG capsule  4 times daily        01/26/24 2213             Note:  This document was prepared using Dragon voice recognition software and may include unintentional dictation errors.

## 2024-01-26 NOTE — ED Triage Notes (Signed)
 Pt has laceration to left thumb.  Pt cut thumb with a putty knife.  Bleeding controlled.  Pt was sent from urgent care for eval .  Pt alert.

## 2024-01-28 ENCOUNTER — Other Ambulatory Visit: Payer: Self-pay

## 2024-01-28 NOTE — H&P (View-Only) (Signed)
 ORTHOPAEDIC SURGERY- CLINIC NOTE  Chief Complaint: Left thumb injury  History of Present Illness:  Jordan Moreno is a 33 y.o. male who presents today for ED follow-up of the above.  Dr. Gust also saw this patient as a consult in the emergency department.  These notes were reviewed for the purposes of this visit.  The patient sustained a laceration over the dorsal aspect of his left thumb at the level of the proximal phalanx while working with a putty knife.  He was noted to have decreased ability to extend the IP joint of the thumb.  Today, he reports that his pain is fairly well-controlled.  He denies any numbness or tingling.  He is unable to extend the thumb at the IP joint.   PMHx, PSurgHx, Fam Hx, Soc Hx, Meds, Allergies: History reviewed. No pertinent past medical history.  History reviewed. No pertinent surgical history.  History reviewed. No pertinent family history.  Social History   Socioeconomic History  . Marital status: Married   Social Drivers of Health   Housing Stability: Unknown (01/28/2024)   Housing Stability Vital Sign   . Homeless in the Last Year: No     Current Outpatient Medications  Medication Sig Dispense Refill  . cephalexin (KEFLEX) 500 MG capsule Take 500 mg by mouth 4 (four) times daily     No current facility-administered medications for this visit.    Not on File  Review of Systems: A 10+ ROS was performed, reviewed, and the pertinent orthopaedic findings are documented in the HPI.  I have reviewed and agree with the ROS captured by the CMA.    Physical Exam: BP 130/80   Wt 73.9 kg (163 lb)  General/Constitutional: No apparent distress: well-nourished and well developed. Eyes: Pupils equal, round with synchronous movement. Lymphatic: No palpable adenopathy. Respiratory: Patient has good chest rise and fall with inspiration and expiration.  All lung fields are clear to auscultation bilaterally.  There is no Rales, rhonchi or wheezes  appreciated. Cardiovascular: Upon auscultation there is a regular rate and rhythm without any murmurs, rubs, gallops or heaves appreciated. Integumentary: No impressive skin lesions present, except as noted in detailed exam. Neuro/Psych: Normal mood and affect, oriented to person, place and time. Musculoskeletal: see exam below  Left Upper Extremity: Laceration over the dorsal aspect of the thumb at the level of the proximal phalanx.  Repaired with sutures.  No significant erythema, drainage, signs of infection.  Patient is unable to extend the IP joint of the thumb.  Patient is able to flex the IP joint and MCP joint of the thumb.  Patient is able to abduct the thumb.  Patient is unable to hold the IP joint of the extended when passively extended for him.  Patient intact to light touch to the radial and ulnar aspects of the thumb.  The tip of the thumb is warm and well-perfused with brisk cap refill.  Imaging and Results: NA  Assessment & Plan: Jordan Moreno is a 33 y.o. male patient with concern for left thumb extensor pollicis longus laceration - I recommend surgical exploration of the wound and repair of presumed laceration of the EPL. - The risk, benefits, alternatives to surgical intervention were discussed with the patient including but not limited to infection, injury to nearby structures, repair failure, need for further surgical intervention, stiffness, weakness, chronic pain - The patient would like to proceed with operative intervention - He will follow-up postoperatively   Jackquline CANDIE Barrack, MD [facility]  Orthopaedics and Sports Medicine [address] [place], [place] [address] Phone: [phone]  This note was generated in part with voice recognition software; please excuse any typographical errors that were not detected and corrected.

## 2024-02-01 ENCOUNTER — Ambulatory Visit: Admission: RE | Admit: 2024-02-01 | Discharge: 2024-02-01 | Disposition: A | Discharge status: Home Health

## 2024-02-01 ENCOUNTER — Ambulatory Visit: Admitting: Anesthesiology

## 2024-02-01 ENCOUNTER — Encounter: Admission: RE | Disposition: A | Discharge status: Home Health | Payer: Self-pay | Source: Home / Self Care

## 2024-02-01 ENCOUNTER — Ambulatory Visit

## 2024-02-01 ENCOUNTER — Other Ambulatory Visit: Payer: Self-pay

## 2024-02-01 DIAGNOSIS — S66222A Laceration of extensor muscle, fascia and tendon of left thumb at wrist and hand level, initial encounter: Secondary | ICD-10-CM | POA: Insufficient documentation

## 2024-02-01 DIAGNOSIS — W260XXA Contact with knife, initial encounter: Secondary | ICD-10-CM | POA: Insufficient documentation

## 2024-02-01 DIAGNOSIS — S61012A Laceration without foreign body of left thumb without damage to nail, initial encounter: Secondary | ICD-10-CM | POA: Diagnosis present

## 2024-02-01 HISTORY — PX: REPAIR EXTENSOR TENDON: SHX5382

## 2024-02-01 SURGERY — REPAIR, TENDON, EXTENSOR
Anesthesia: General | Site: Thumb | Laterality: Left

## 2024-02-01 MED ORDER — FENTANYL CITRATE (PF) 100 MCG/2ML IJ SOLN
INTRAMUSCULAR | Status: DC | PRN
  Administered 2024-02-01 (×3): 50 ug via INTRAVENOUS

## 2024-02-01 MED ORDER — MIDAZOLAM HCL 2 MG/2ML IJ SOLN
INTRAMUSCULAR | Status: AC
Start: 2024-02-01 — End: 2024-02-01
  Filled 2024-02-01: qty 2

## 2024-02-01 MED ORDER — PROPOFOL 500 MG/50ML IV EMUL
INTRAVENOUS | Status: DC | PRN
  Administered 2024-02-01: 100 ug/kg/min via INTRAVENOUS

## 2024-02-01 MED ORDER — 0.9 % SODIUM CHLORIDE (POUR BTL) OPTIME
TOPICAL | Status: DC | PRN
  Administered 2024-02-01: 500 mL

## 2024-02-01 MED ORDER — KETAMINE HCL 10 MG/ML IJ SOLN
INTRAMUSCULAR | Status: DC | PRN
  Administered 2024-02-01: 30 mg via INTRAVENOUS

## 2024-02-01 MED ORDER — LACTATED RINGERS IV SOLN: INTRAVENOUS | Status: DC | PRN

## 2024-02-01 MED ORDER — CEFAZOLIN SODIUM-DEXTROSE 2-4 GM/100ML-% IV SOLN
2.0000 g | INTRAVENOUS | Status: AC
  Administered 2024-02-01: 2 g via INTRAVENOUS

## 2024-02-01 MED ORDER — PROPOFOL 1000 MG/100ML IV EMUL
INTRAVENOUS | Status: AC
  Filled 2024-02-01: qty 100

## 2024-02-01 MED ORDER — BUPIVACAINE HCL (PF) 0.5 % IJ SOLN
INTRAMUSCULAR | Status: DC | PRN
  Administered 2024-02-01: 20 mL via PERINEURAL

## 2024-02-01 MED ORDER — EPHEDRINE 5 MG/ML INJ
INTRAVENOUS | Status: AC
Start: 2024-02-01 — End: 2024-02-01
  Filled 2024-02-01: qty 5

## 2024-02-01 MED ORDER — PROPOFOL 10 MG/ML IV BOLUS
INTRAVENOUS | Status: DC | PRN
  Administered 2024-02-01: 60 mg via INTRAVENOUS

## 2024-02-01 MED ORDER — DEXMEDETOMIDINE HCL IN NACL 200 MCG/50ML IV SOLN
INTRAVENOUS | Status: DC | PRN
  Administered 2024-02-01: 12 ug via INTRAVENOUS

## 2024-02-01 MED ORDER — MIDAZOLAM HCL (PF) 2 MG/2ML IJ SOLN
INTRAMUSCULAR | Status: DC | PRN
  Administered 2024-02-01 (×2): 2 mg via INTRAVENOUS

## 2024-02-01 MED ORDER — MIDAZOLAM HCL 2 MG/2ML IJ SOLN
INTRAMUSCULAR | Status: AC
  Filled 2024-02-01: qty 2

## 2024-02-01 MED ORDER — FENTANYL CITRATE (PF) 100 MCG/2ML IJ SOLN
INTRAMUSCULAR | Status: AC
  Filled 2024-02-01: qty 2

## 2024-02-01 MED ORDER — BUPIVACAINE HCL (PF) 0.5 % IJ SOLN
INTRAMUSCULAR | Status: AC
  Filled 2024-02-01: qty 30

## 2024-02-01 MED ORDER — FENTANYL CITRATE (PF) 50 MCG/ML IJ SOSY
PREFILLED_SYRINGE | INTRAMUSCULAR | Status: AC
  Filled 2024-02-01: qty 1

## 2024-02-01 MED ORDER — KETAMINE HCL 50 MG/5ML IJ SOSY
PREFILLED_SYRINGE | INTRAMUSCULAR | Status: AC
  Filled 2024-02-01: qty 5

## 2024-02-01 MED ORDER — CEFAZOLIN SODIUM-DEXTROSE 2-4 GM/100ML-% IV SOLN
INTRAVENOUS | Status: AC
  Filled 2024-02-01: qty 100

## 2024-02-01 MED ORDER — EPHEDRINE SULFATE-NACL 50-0.9 MG/10ML-% IV SOSY
PREFILLED_SYRINGE | INTRAVENOUS | Status: DC | PRN
  Administered 2024-02-01: 5 mg via INTRAVENOUS

## 2024-02-01 MED ORDER — OXYCODONE HCL 5 MG PO TABS: 5.0000 mg | ORAL_TABLET | Freq: Three times a day (TID) | ORAL | 0 refills | Status: AC | PRN

## 2024-02-01 SURGICAL SUPPLY — 23 items
BLADE MINI RND TIP GREEN BEAV (BLADE) IMPLANT
BNDG ELASTIC 4X5.8 VLCR NS LF (GAUZE/BANDAGES/DRESSINGS) ×2 IMPLANT
BNDG ESMARCH 4X12 STRL LF (GAUZE/BANDAGES/DRESSINGS) ×1 IMPLANT
CHLORAPREP W/TINT 26 (MISCELLANEOUS) ×1 IMPLANT
CORD BIP STRL DISP 12FT (MISCELLANEOUS) IMPLANT
CUFF TOURN SGL QUICK 18X4 (TOURNIQUET CUFF) IMPLANT
ELECTRODE REM PT RTRN 9FT ADLT (ELECTROSURGICAL) ×1 IMPLANT
GAUZE SPONGE 4X4 12PLY STRL (GAUZE/BANDAGES/DRESSINGS) ×1 IMPLANT
GAUZE XEROFORM 1X8 LF (GAUZE/BANDAGES/DRESSINGS) ×1 IMPLANT
GLOVE BIO SURGEON STRL SZ7.5 (GLOVE) ×1 IMPLANT
GLOVE BIOGEL PI IND STRL 8 (GLOVE) ×1 IMPLANT
GOWN SRG XL LVL 3 NONREINFORCE (GOWNS) ×1 IMPLANT
GOWN STRL REUS W/ TWL LRG LVL3 (GOWN DISPOSABLE) IMPLANT
KIT TURNOVER KIT A (KITS) ×1 IMPLANT
NS IRRIG 500ML POUR BTL (IV SOLUTION) ×1 IMPLANT
PACK EXTREMITY ARMC (MISCELLANEOUS) ×1 IMPLANT
PAD CAST 4YDX4 CTTN HI CHSV (CAST SUPPLIES) ×1 IMPLANT
PADDING CAST BLEND 4X4 STRL (MISCELLANEOUS) ×1 IMPLANT
SOLN STERILE WATER BTL 1000 ML (IV SOLUTION) IMPLANT
SPLINT CAST 1 STEP 4X30 (MISCELLANEOUS) ×1 IMPLANT
SUT ETHIBOND 3 0 SH 1 (SUTURE) IMPLANT
SUT ETHILON 4 0 P 3 18 (SUTURE) ×2 IMPLANT
SYR 10ML LL (SYRINGE) ×1 IMPLANT

## 2024-02-01 NOTE — Anesthesia Preprocedure Evaluation (Addendum)
 Anesthesia Evaluation  Patient identified by MRN, date of birth, ID band Patient awake    Reviewed: Allergy & Precautions, H&P , NPO status , Patient's Chart, lab work & pertinent test results  Airway Mallampati: II  TM Distance: >3 FB Neck ROM: full    Dental no notable dental hx.    Pulmonary neg pulmonary ROS   Pulmonary exam normal        Cardiovascular negative cardio ROS Normal cardiovascular exam     Neuro/Psych negative neurological ROS  negative psych ROS   GI/Hepatic negative GI ROS, Neg liver ROS,,,  Endo/Other  negative endocrine ROS    Renal/GU negative Renal ROS  negative genitourinary   Musculoskeletal   Abdominal   Peds  Hematology negative hematology ROS (+)   Anesthesia Other Findings Past Medical History: No date: Hydrocele No date: Inguinal hernia  Past Surgical History: 2009: INGUINAL HERNIA REPAIR; Right  BMI    Body Mass Index: 22.32 kg/m      Reproductive/Obstetrics negative OB ROS                              Anesthesia Physical Anesthesia Plan  ASA: 1  Anesthesia Plan: General   Post-op Pain Management: Regional block*   Induction: Intravenous  PONV Risk Score and Plan: Propofol infusion and TIVA  Airway Management Planned: Natural Airway  Additional Equipment:   Intra-op Plan:   Post-operative Plan:   Informed Consent: I have reviewed the patients History and Physical, chart, labs and discussed the procedure including the risks, benefits and alternatives for the proposed anesthesia with the patient or authorized representative who has indicated his/her understanding and acceptance.     Dental Advisory Given  Plan Discussed with: CRNA and Surgeon  Anesthesia Plan Comments:          Anesthesia Quick Evaluation

## 2024-02-01 NOTE — Transfer of Care (Signed)
 Immediate Anesthesia Transfer of Care Note  Patient: Jordan Moreno  Procedure(s) Performed: REPAIR, TENDON, EXTENSOR (Left: Thumb)  Patient Location: PACU  Anesthesia Type:General  Level of Consciousness: drowsy  Airway & Oxygen Therapy: Patient Spontanous Breathing and Patient connected to face mask oxygen  Post-op Assessment: Report given to RN and Post -op Vital signs reviewed and stable  Post vital signs: Reviewed and stable  Last Vitals:  Vitals Value Taken Time  BP 105/54 02/01/24 14:00  Temp 37.1 C 02/01/24 13:58  Pulse 71 02/01/24 14:02  Resp 17 02/01/24 14:02  SpO2 99 % 02/01/24 14:02  Vitals shown include unfiled device data.  Last Pain:  Vitals:   02/01/24 1053  PainSc: 5          Complications: No notable events documented.

## 2024-02-01 NOTE — Op Note (Signed)
 Date of procedure:  02/01/2024  Surgeon:  Jackquline CANDIE Barrack, MD   Procedure(s) performed:   Procedure(s): REPAIR, TENDON, EXTENSOR (Left) (CPT [phone])  Assistant(s): none   Preoperative diagnosis:   Laceration of left thumb with tendon involvement, initial encounter S61.012A    Postoperative diagnosis:   Left extensor pollicis longus laceration   Procedure findings: Zone II EPL tendon laceration  Anesthesia:  Regional + MAC   Estimated blood loss:  minimal   Specimen:  None   Complications:  None apparent   Implants: None   Indications: 33 y.o. year old male with left laceration over the dorsum of the thumb resulting in inability to extend the IP joint..  We discussed operative treatment and explained the risks, benefits, and alternatives as well as the expected postoperative course.  The patient elected to proceed with surgery.   Procedure in detail:  The patient was met in the pre-op holding area and the left upper extremity was marked and the consent confirmed.  Anesthesia placed a short acting regional block.  The patient was then taken to the operating room and the hand table was attached to the stretcher.  Antibiotics were administered prior to incision.  All bony prominences were well padded.  The operative extremity was then prepped and draped in the usual sterile fashion.  A timeout was performed confirming the correct patient, site, and procedure.   I began by extending to the traumatic wound both proximally and distally.  The proximal and distal ends of the extensor pollicis longus tendon were identified and the tendon was fully lacerated.  The two ends were easily approximated. The tendon was too flat at this level to accommodate a core suture. Using 3-0 ethibond, multiple figure-of-8 sutures and a horizontal mattress sutures were placed to secure the tendon repair.   The wound was irrigated with normal saline.  The incision was closed with 4-0 Nylon.  Sterile dressings  were applied. A thumb spica splint was placed. The patient was transferred to the PACU in stable condition.  Counts were correct x2.   Postoperative plan: Thumb spica splint with transition to cast versus splint at first postoperative appointment Extensor tendon repair protocol OT

## 2024-02-01 NOTE — Discharge Instructions (Signed)
 Discharge Instructions After Surgery   Dressings   Do not remove your dressing. Keep it clean and dry until your follow-up visit.   It is okay to have a small spot of blood on your dressing as long as it does not keep getting bigger.   Cover the dressing and keep it dry in the shower or bath. Keep your arm up and out of the water.   Diet   Return to your regular diet as tolerated.   Activity   If your fingers are free, work on making a full fist and straightening out the fingers within the limits of your splint.  Move other joints such as your elbow or shoulder multiple times a day to prevent getting stiff.  Do not try to move joints that are within a splint.  No lifting, carrying, pushing, or pulling with your arm until your follow-up visit. You may use your hand or fingers for simple things like writing, typing, eating, and brushing your teeth.   If you were given a sling, wear it while your hand or arm is numb. You can stop wearing the sling when you have feeling and strength back in your hand or arm. You may also use the sling as needed to elevate, protect, or support your arm until your follow-up visit.   Pain   The first 48 hours after surgery can hurt the most. You should use the different types of pain medicine along with rest and elevation to help keep the pain manageable.   NSAIDS such as ibuprofen  (Advil  or Motrin ) or naproxen (Aleve) can help with pain and swelling.   Acetaminophen  (Tylenol ) can be used with NSAIDs. Do not take more than 3000 mg of Tylenol  in a 24-hour period.   Narcotic pain medicine such as hydrocodone or oxycodone , should be used as prescribed. Stop taking as soon as possible. Take with food to help prevent nausea. Do not drink alcohol or drive while taking narcotics. Be aware that hydrocodone tablets often already contain acetaminophen  (Tylenol ).  Elevating your operative extremity above the level of your heart will help with pain and swelling. You can  rest your arm on several pillows for support while sleeping or resting.   Constipation   Narcotic pain medicines, changes in eating and drinking, and less activity may cause constipation after surgery.   Over-the-counter stool softeners like docusate (Colace), Senna, or Miralax can help. Follow the directions on the bottle.   Stay hydrated and try to eat high fiber foods.   When to call your surgeon   If your dressing gets wet or very dirty.   Fever more than 101 F.   Incision that is very red, swollen, draining pus, or feels hot.   Severe pain, not getting better with pain medicine.   Severe swelling, even with elevation.   If your fingers become cool, cold, or dark in color.   Bleeding that is getting bigger or soaking through the dressing.   Severe nausea and vomiting.   Problems using the bathroom or no bowel movement for 2-3 days.   For questions or concerns, please call [phone].  Jackquline CANDIE Barrack, MD [facility]

## 2024-02-01 NOTE — Interval H&P Note (Signed)
 History and Physical Interval Note:  02/01/2024 12:41 PM  Jordan Moreno  has presented today for surgery, with the diagnosis of Laceration of left thumb with tendon involvement, initial encounter S61.012A.  The various methods of treatment have been discussed with the patient and family. After consideration of risks, benefits and other options for treatment, the patient has consented to  Procedure(s): REPAIR, TENDON, EXTENSOR (Left) as a surgical intervention.  The patient's history has been reviewed, patient examined, no change in status, stable for surgery.  I have reviewed the patient's chart and labs.  Questions were answered to the patient's satisfaction.     Jackquline GORMAN Barrack

## 2024-02-01 NOTE — Anesthesia Procedure Notes (Addendum)
 Anesthesia Regional Block: Supraclavicular block   Pre-Anesthetic Checklist: , timeout performed,  Correct Patient, Correct Site, Correct Laterality,  Correct Procedure, Correct Position, site marked,  Risks and benefits discussed,  Surgical consent,  Pre-op evaluation,  At surgeon's request and post-op pain management  Laterality: Upper and Left  Prep: chloraprep       Needles:  Injection technique: Single-shot  Needle Type: Stimiplex     Needle Length: 9cm  Needle Gauge: 22     Additional Needles:   Procedures:,,,, ultrasound used (permanent image in chart),,    Narrative:  Start time: 02/01/2024 11:44 AM End time: 02/01/2024 11:47 AM Injection made incrementally with aspirations every 5 mL.  Performed by: Personally  Anesthesiologist: Vicci Camellia Glatter, MD  Additional Notes: Patient consented for risk and benefits of nerve block including but not limited to nerve damage, failed block, bleeding and infection.  Patient voiced understanding.  Functioning IV was confirmed and monitors were applied.  Timeout done prior to procedure and prior to any sedation being given to the patient.  Patient confirmed procedure site prior to any sedation given to the patient. Sterile prep,hand hygiene and sterile gloves were used.  Minimal sedation used for procedure.  No paresthesia endorsed by patient during the procedure.  Negative aspiration and negative test dose prior to incremental administration of local anesthetic. The patient tolerated the procedure well with no immediate complications.

## 2024-02-01 NOTE — Anesthesia Procedure Notes (Signed)
 Date/Time: 02/01/2024 1:15 PM  Performed by: Duwayne Craven, CRNAPre-anesthesia Checklist: Patient identified, Emergency Drugs available, Suction available, Patient being monitored and Timeout performed Patient Re-evaluated:Patient Re-evaluated prior to induction Oxygen Delivery Method: Simple face mask Ventilation: Oral airway inserted - appropriate to patient size Placement Confirmation: CO2 detector and positive ETCO2

## 2024-02-01 NOTE — Anesthesia Postprocedure Evaluation (Signed)
 Anesthesia Post Note  Patient: Tuvia Woodrick  Procedure(s) Performed: REPAIR, TENDON, EXTENSOR (Left: Thumb)  Patient location during evaluation: PACU Anesthesia Type: General Level of consciousness: awake and alert Pain management: pain level controlled Vital Signs Assessment: post-procedure vital signs reviewed and stable Respiratory status: spontaneous breathing, nonlabored ventilation and respiratory function stable Cardiovascular status: blood pressure returned to baseline and stable Postop Assessment: no apparent nausea or vomiting Anesthetic complications: no   No notable events documented.   Last Vitals:  Vitals:   02/01/24 1515 02/01/24 1528  BP: 117/69 116/69  Pulse: 62 66  Resp: 16 16  Temp: 36.5 C 36.7 C  SpO2: 100% 98%    Last Pain:  Vitals:   02/01/24 1528  TempSrc: Temporal  PainSc: 0-No pain                 Camellia Merilee Louder

## 2024-02-14 NOTE — Therapy (Unsigned)
 OUTPATIENT OCCUPATIONAL THERAPY ORTHO EVALUATION  Patient Name: Jordan Moreno MRN: [id number] DOB:[birth year], 33 y.o., male Today's Date: 02/15/2024  PCP: No PCP REFERRING PROVIDER: Dr Ezra  END OF SESSION:  OT End of Session - 02/15/24 1658     Visit Number 1    Number of Visits 16    Date for Recertification  04/25/24    OT Start Time 0738    OT Stop Time 0815    OT Time Calculation (min) 37 min    Activity Tolerance Patient tolerated treatment well    Behavior During Therapy [facility] for tasks assessed/performed          Past Medical History:  Diagnosis Date   Hydrocele    Inguinal hernia    Past Surgical History:  Procedure Laterality Date   INGUINAL HERNIA REPAIR Right 2009   REPAIR EXTENSOR TENDON Left 02/01/2024   Procedure: REPAIR, TENDON, EXTENSOR;  Surgeon: [name], MD;  Location: [facility] ORS;  Service: Orthopedics;  Laterality: Left;   There are no active problems to display for this patient.   ONSET DATE: 02/01/24  REFERRING DIAG: R EPL repair  THERAPY DIAG:  Scar condition and fibrosis of skin  Stiffness of right hand, not elsewhere classified  Stiffness of right wrist, not elsewhere classified  Muscle weakness (generalized)  Rationale for Evaluation and Treatment: Rehabilitation  SUBJECTIVE:   SUBJECTIVE STATEMENT: I am glad to get splint before the holidays- not really any pain - stitches were removed yesterday Pt accompanied by: self  PERTINENT HISTORY: 02/14/24: Left upper Extremity: Incision is clean, dry, intact. No significant erythema, drainage, or other signs of infection. Sutures are in place. Sensation is intact to light touch to the radial and ulnar aspects of the thumb. The tip of the thumb is warm and well-perfused with brisk cap refill. I did not ask him to demonstrate range of motion of the thumb or actively extended but he is holding the thumb extended on examination.  Imaging and Results: NA  Assessment &  Plan: Jordan Moreno is a 33 y.o. male patient who is s/p left EPL laceration repair on 02/01/2024 - Sutures removed in clinic today - New thumb spica splint placed today - Referral to occupational therapy for custom orthosis, tendon repair protocol - Follow-up in 4 weeks   PRECAUTIONS: Orthopedic orders- splint on and NWB or use of R hand    WEIGHT BEARING RESTRICTIONS: NWB  PAIN:  Are you having pain? No pain reported  FALLS: Has patient fallen in last 6 months? 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  Likes to workout in his free time and spend time with his family.  Patient has a 10-week newborn baby  PATIENT GOALS: Want to get this thumb better so that I can get back to work and use it to work out and do things and help with my daughter  NEXT MD VISIT: 03/15/24  OBJECTIVE:  Note: Objective measures were completed at Evaluation unless otherwise noted.  HAND DOMINANCE: Right  ADLs: Unable to use her right hand-mostly thumb with no gripping no pinching or pulling-splint on all the time  FUNCTIONAL OUTCOME MEASURES: PRWHE pain and function   UPPER EXTREMITY ROM:     Active ROM Right eval Left eval  Shoulder flexion    Shoulder abduction    Shoulder adduction    Shoulder extension    Shoulder internal rotation    Shoulder external rotation  Elbow flexion    Elbow extension    Wrist flexion    Wrist extension    Wrist ulnar deviation    Wrist radial deviation    Wrist pronation    Wrist supination    (Blank rows = not tested)  Active ROM Right eval Left eval  Thumb MCP (0-60)    Thumb IP (0-80)    Thumb Radial abd/add (0-55)     Thumb Palmar abd/add (0-45)     Thumb Opposition to Small Finger     Index MCP (0-90)     Index PIP (0-100)     Index DIP (0-70)      Long MCP (0-90)      Long PIP (0-100)      Long DIP (0-70)      Ring MCP (0-90)      Ring PIP (0-100)      Ring DIP (0-70)       Little MCP (0-90)      Little PIP (0-100)      Little DIP (0-70)      (Blank rows = not tested) Able to make composite fist but stiffness reported in composite flexion  HAND FUNCTION: Grip strength: Right:   lbs; Left:   lbs, Lateral pinch: Right:   lbs, Left:   lbs, and 3 point pinch: Right:   lbs, Left:   lbs  COORDINATION: Impaired thumb immobilized because of EPL repair  SENSATION: Patient report some sensation deficit or numbness in the tip of the thumb  EDEMA: Minimal edema noticed  COGNITION: Overall cognitive status: Within functional limits for tasks assessed        TREATMENT DATE: 02/15/24                                                                                                                            Patient educated to perform contrast if increase edema up to   3 times a day to help for edema and pain.     Fabricated this date a custom forearm-based wrist and thumb static splint wrist in slight 10 to 20 degrees extension and thumb in slight radial abduction same position as he was in soft cast. To protect repair. Patient was educated on doing contrast to decrease pain.  Edema. Patient to follow doctors instructions of wearing splint. All the time on to protect repair. Pt ed on scar massage and use of cica scar pad towards the end of the week As well as tendon glides 10 reps for digits No motion to wrist and thumb. Hand out review and provided    PATIENT EDUCATION: Education details: findings of eval and HEP / splint wearing Person educated: Patient Education method: Explanation, Demonstration, Tactile cues, Verbal cues, and Handouts Education comprehension: verbalized understanding, returned demonstration, verbal cues required, and needs further education      GOALS: Goals reviewed with patient? Yes   SHORT TERM GOALS: Target date: 2 weeks   Patient  to tolerate wearing of splint and be independent in donning and doffing of protective  static wrist and thumb splint to protect right EPL repair Baseline: Patient arrive soft cast.  Removed and fabricated custom forearm brace wrist and thumb static splint to protect repair Goal status: INITIAL     LONG TERM GOALS: Target date: 12 weeks      1.  Right wrist active range of motion improved to within normal range in all planes to use in bathing and petting her animals symptom-free Baseline: 13 days postop immobilized Goal status: INITIAL   2.  Right thumb palmar radial abduction improved to within normal limits for patient to be able to grasp a cup and turn doorknob symptom-free Baseline: 13 days postop immobilized to protect repair Goal status: INITIAL   3.  Right thumb flexion and opposition improved to within functional limits for patient to retrieve objects out of palm Baseline: 13 days postop immobilization of thumb Goal status: INITIAL   4.  Right thumb lateral 3-point pinch and grip improve within normal range for her age to be able to do buttons and open Ziploc bags and carry groceries symptom-free Baseline: Not tested Goal status: INITIAL   5.  PRWHE function score improve to less than 10/50 Baseline: 50/50 score for function on PRWHE Goal status: INITIAL    ASSESSMENT:  CLINICAL IMPRESSION: Patient seen today for occupational therapy evaluation for right EPL repair 13 days postop.  Patient arrived with soft cast in place.  Patient was evaluated and custom forearm-based volar brace fabricated with digits free and thumb in slight radial abduction with IP in extension and wrist 20 to 30 degrees extension.  Patient's sutures was removed yesterday.  Patient was educated on tendon glides as well as scar massage and use of Cica -Care scar pad later in the week.  Patient to wear brace at all times.  Follow orthopedics instructions.  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  Patient  can benefit from skilled OT services to decrease scar tissue increase motion and increase strength following in the [place]  hand protocol.  PERFORMANCE DEFICITS: in functional skills including ADLs, IADLs, ROM, strength, pain, flexibility, decreased knowledge of use of DME, and UE functional use,   and psychosocial skills including environmental adaptation and routines and behaviors.   IMPAIRMENTS: are limiting patient from ADLs, IADLs, rest and sleep, play, leisure, and social participation.   COMORBIDITIES: has no other co-morbidities that affects occupational performance. Patient will benefit from skilled OT to address above impairments and improve overall function.  MODIFICATION OR ASSISTANCE TO COMPLETE EVALUATION: No modification of tasks or assist necessary to complete an evaluation.  OT OCCUPATIONAL PROFILE AND HISTORY: Problem focused assessment: Including review of records relating to presenting problem.  CLINICAL DECISION MAKING: LOW - limited treatment options, no task modification necessary  REHAB POTENTIAL: Good for goals  EVALUATION COMPLEXITY: Low     PLAN:  OT FREQUENCY: 1-2x/week  OT DURATION: 10 weeks  PLANNED INTERVENTIONS: 97168 OT Re-evaluation, 97535 self care/ADL training, 02889 therapeutic exercise, 97530 therapeutic activity, 97112 neuromuscular re-education, 97140 manual therapy, 97018 paraffin, 02960 fluidotherapy, 97034 contrast bath, 97760 Orthotic Initial, [id number] Orthotic/Prosthetic subsequent, scar mobilization, passive range of motion, patient/family education, and DME and/or AE instructions    CONSULTED AND AGREED WITH PLAN OF CARE: Patient     Ancel Peters, OTR/L,CLT 02/15/2024, 5:00 PM

## 2024-02-15 ENCOUNTER — Encounter: Payer: Self-pay | Admitting: Occupational Therapy

## 2024-02-15 ENCOUNTER — Ambulatory Visit: Admitting: Occupational Therapy

## 2024-02-15 DIAGNOSIS — M25631 Stiffness of right wrist, not elsewhere classified: Secondary | ICD-10-CM | POA: Diagnosis present

## 2024-02-15 DIAGNOSIS — L905 Scar conditions and fibrosis of skin: Secondary | ICD-10-CM | POA: Diagnosis present

## 2024-02-15 DIAGNOSIS — M6281 Muscle weakness (generalized): Secondary | ICD-10-CM | POA: Diagnosis present

## 2024-02-15 DIAGNOSIS — M25641 Stiffness of right hand, not elsewhere classified: Secondary | ICD-10-CM | POA: Diagnosis present

## 2024-02-22 ENCOUNTER — Ambulatory Visit: Admitting: Occupational Therapy

## 2024-02-22 DIAGNOSIS — M25631 Stiffness of right wrist, not elsewhere classified: Secondary | ICD-10-CM | POA: Diagnosis present

## 2024-02-22 DIAGNOSIS — L905 Scar conditions and fibrosis of skin: Secondary | ICD-10-CM | POA: Insufficient documentation

## 2024-02-22 DIAGNOSIS — M25641 Stiffness of right hand, not elsewhere classified: Secondary | ICD-10-CM | POA: Insufficient documentation

## 2024-02-22 DIAGNOSIS — M6281 Muscle weakness (generalized): Secondary | ICD-10-CM | POA: Diagnosis present

## 2024-02-22 NOTE — Therapy (Signed)
 OUTPATIENT OCCUPATIONAL THERAPY ORTHO TREATMENT  Patient Name: Jordan Moreno MRN: [id number] DOB:November 13, 1990, 33 y.o., male Today's Date: 02/22/2024  PCP: No PCP REFERRING PROVIDER: Dr Ezra  END OF SESSION:  OT End of Session - 02/22/24 0947     Visit Number 2    Number of Visits 16    Date for Recertification  04/25/24    OT Start Time 0947    Activity Tolerance Patient tolerated treatment well    Behavior During Therapy [facility] for tasks assessed/performed          Past Medical History:  Diagnosis Date   Hydrocele    Inguinal hernia    Past Surgical History:  Procedure Laterality Date   INGUINAL HERNIA REPAIR Right 2009   REPAIR EXTENSOR TENDON Left 02/01/2024   Procedure: REPAIR, TENDON, EXTENSOR;  Surgeon: [name], MD;  Location: [facility] ORS;  Service: Orthopedics;  Laterality: Left;   There are no active problems to display for this patient.   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 No significant erythema, drainage, or other signs of infection. Sutures are in place. Sensation is intact to light touch to the radial and ulnar aspects of the thumb. The tip of the thumb is warm and well-perfused with brisk cap refill. I did not ask him to demonstrate range of motion of the thumb or actively extended but he is holding the thumb extended on examination.  Imaging and Results: NA  Assessment & Plan: Jordan Moreno is a  33 y.o. male patient who is s/p left EPL laceration repair on 02/01/2024 - Sutures removed in clinic today - New thumb spica splint placed today - Referral to occupational therapy for custom orthosis, tendon repair protocol - Follow-up in 4 weeks   PRECAUTIONS: Orthopedic orders- splint on and NWB or use of R hand    WEIGHT BEARING RESTRICTIONS: NWB  PAIN:  Are you having pain? No pain reported  FALLS: Has patient fallen in last 6 months? 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  Likes to workout in his free time and spend time with his family.  Patient has a 10-week newborn baby  PATIENT GOALS: Want to get this thumb better so that I can get back to work and use it to work out and do things and help with my daughter  NEXT MD VISIT: 03/15/24  OBJECTIVE:  Note: Objective measures were completed at Evaluation unless otherwise noted.  HAND DOMINANCE: Right  ADLs: Unable to use her right hand-mostly thumb with no gripping no pinching or pulling-splint on all the time  FUNCTIONAL OUTCOME MEASURES: PRWHE pain and function   UPPER EXTREMITY ROM:     Active ROM Right eval Left eval  Shoulder flexion    Shoulder abduction    Shoulder adduction    Shoulder extension    Shoulder internal rotation    Shoulder external rotation  Elbow flexion    Elbow extension    Wrist flexion    Wrist extension    Wrist ulnar deviation    Wrist radial deviation    Wrist pronation    Wrist supination    (Blank rows = not tested)  Active ROM Right eval Left eval  Thumb MCP (0-60)    Thumb IP (0-80)    Thumb Radial abd/add (0-55)     Thumb Palmar abd/add (0-45)     Thumb Opposition to Small Finger     Index MCP (0-90)     Index PIP (0-100)     Index DIP (0-70)      Long MCP (0-90)      Long PIP (0-100)      Long DIP (0-70)      Ring MCP (0-90)      Ring PIP (0-100)      Ring DIP (0-70)      Little MCP (0-90)       Little PIP (0-100)      Little DIP (0-70)      (Blank rows = not tested) Able to make composite fist but stiffness reported in composite flexion  HAND FUNCTION: Grip strength: Right:   lbs; Left:   lbs, Lateral pinch: Right:   lbs, Left:   lbs, and 3 point pinch: Right:   lbs, Left:   lbs  COORDINATION: Impaired thumb immobilized because of EPL repair  SENSATION: Patient report some sensation deficit or numbness in the tip of the thumb  EDEMA: Minimal edema noticed  COGNITION: Overall cognitive status: Within functional limits for tasks assessed        TREATMENT DATE: 02/22/24                                                                                                                            Patient educated to perform contrast to decrease edema in palm   Done today in clinic 8 min prior to scar massage and retrograde massage to thumb to forearm  And some light myofascial release to volar forearm and wrist   Contrast can be done at home 3 times a day to help for edema and pain.     Pt arrive with his  custom forearm-based wrist and thumb static splint  with wrist in slight 10 to 20 degrees extension and thumb in slight radial abduction in place on L hand To protect repair.  Patient to follow doctors instructions of wearing splint. All the time on to protect repair. Done scar massage this date by OT  - pt cont to  have some scabs in place  Review again with pt scar massage and use of cica scar pad t As well as massage for edema in thumb  And light coban wrap to thumb at night time or if increase edema during day AAROM and AROM for  tendon glides 10 reps for digits No motion to wrist and thumb.  PATIENT EDUCATION: Education details: findings of eval and HEP / splint wearing Person educated: Patient Education method: Explanation, Demonstration, Tactile cues, Verbal cues, and Handouts Education comprehension: verbalized understanding, returned demonstration,  verbal cues required, and needs further education      GOALS: Goals reviewed with patient? Yes   SHORT TERM GOALS: Target date: 2 weeks   Patient to tolerate wearing of splint and be independent in donning and doffing of protective static wrist and thumb splint to protect right EPL repair Baseline: Patient arrive soft cast.  Removed and fabricated custom forearm brace wrist and thumb static splint to protect repair Goal status: INITIAL     LONG TERM GOALS: Target date: 12 weeks      1.  Right wrist active range of motion improved to within normal range in all planes to use in bathing and petting her animals symptom-free Baseline: 13 days postop immobilized Goal status: INITIAL   2.  Right thumb palmar radial abduction improved to within normal limits for patient to be able to grasp a cup and turn doorknob symptom-free Baseline: 13 days postop immobilized to protect repair Goal status: INITIAL   3.  Right thumb flexion and opposition improved to within functional limits for patient to retrieve objects out of palm Baseline: 13 days postop immobilization of thumb Goal status: INITIAL   4.  Right thumb lateral 3-point pinch and grip improve within normal range for her age to be able to do buttons and open Ziploc bags and carry groceries symptom-free Baseline: Not tested Goal status: INITIAL   5.  PRWHE function score improve to less than 10/50 Baseline: 50/50 score for function on PRWHE Goal status: INITIAL    ASSESSMENT:  CLINICAL IMPRESSION: Patient seen today for occupational therapy follow-up for right EPL repair on 02/01/24.  Patient arrived with  custom forearm-based volar brace fabricated with digits free and thumb in slight radial abduction with IP in extension and wrist 20 to 30 degrees extension  in place. No pain or issues with splint - review with pt again some scar massage and use of cica scar pad - Review again tendon glides as well as scar massage and use of Cica  -Care scar pad. Patient to wear brace at all times.  Follow orthopedics instructions.  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  Patient can benefit from skilled OT services to decrease scar tissue increase motion and increase strength following in the [place]  hand protocol.  PERFORMANCE DEFICITS: in functional skills including ADLs, IADLs, ROM, strength, pain, flexibility, decreased knowledge of use of DME, and UE functional use,   and psychosocial skills including environmental adaptation and routines and behaviors.   IMPAIRMENTS: are limiting patient from ADLs, IADLs, rest and sleep, play, leisure, and social participation.   COMORBIDITIES: has no other co-morbidities that affects occupational performance. Patient will benefit from skilled OT to address above impairments and improve overall function.  MODIFICATION OR ASSISTANCE TO COMPLETE EVALUATION: No modification of tasks or assist necessary to complete an evaluation.  OT OCCUPATIONAL PROFILE AND HISTORY: Problem focused assessment: Including review of records relating to presenting problem.  CLINICAL DECISION MAKING: LOW - limited treatment options, no task modification necessary  REHAB POTENTIAL: Good for goals  EVALUATION COMPLEXITY: Low     PLAN:  OT FREQUENCY: 1-2x/week  OT DURATION: 10 weeks  PLANNED INTERVENTIONS: 97168 OT Re-evaluation, 97535 self care/ADL training, 02889 therapeutic exercise, 97530  therapeutic activity, [id number] neuromuscular re-education, 97140 manual therapy, 97018 paraffin, 02960 fluidotherapy, 97034 contrast bath, 97760 Orthotic Initial, [id number] Orthotic/Prosthetic subsequent, scar mobilization, passive range of motion, patient/family education, and DME and/or AE instructions    CONSULTED AND AGREED WITH PLAN OF CARE: Patient     Ancel Peters, OTR/L,CLT 02/22/2024, 9:48 AM

## 2024-02-25 ENCOUNTER — Ambulatory Visit: Admitting: Occupational Therapy

## 2024-02-29 ENCOUNTER — Ambulatory Visit: Admitting: Occupational Therapy

## 2024-02-29 DIAGNOSIS — M25641 Stiffness of right hand, not elsewhere classified: Secondary | ICD-10-CM

## 2024-02-29 DIAGNOSIS — L905 Scar conditions and fibrosis of skin: Secondary | ICD-10-CM | POA: Diagnosis not present

## 2024-02-29 DIAGNOSIS — M6281 Muscle weakness (generalized): Secondary | ICD-10-CM

## 2024-02-29 DIAGNOSIS — M25631 Stiffness of right wrist, not elsewhere classified: Secondary | ICD-10-CM

## 2024-02-29 NOTE — Therapy (Signed)
 OUTPATIENT OCCUPATIONAL THERAPY ORTHO TREATMENT  Patient Name: Jordan Moreno MRN: [id number] DOB:1990/06/17, 33 y.o., male Today's Date: 02/29/2024  PCP: No PCP REFERRING PROVIDER: Dr Ezra  END OF SESSION:  OT End of Session - 02/29/24 1002     Visit Number 3    Number of Visits 16    Date for Recertification  04/25/24    OT Start Time 0800    OT Stop Time 0820    OT Time Calculation (min) 20 min    Activity Tolerance Patient tolerated treatment well    Behavior During Therapy [facility] for tasks assessed/performed          Past Medical History:  Diagnosis Date   Hydrocele    Inguinal hernia    Past Surgical History:  Procedure Laterality Date   INGUINAL HERNIA REPAIR Right 2009   REPAIR EXTENSOR TENDON Left 02/01/2024   Procedure: REPAIR, TENDON, EXTENSOR;  Surgeon: [name], MD;  Location: [facility] ORS;  Service: Orthopedics;  Laterality: Left;   There are no active problems to display for this patient.   ONSET DATE: 02/01/24  REFERRING DIAG: R EPL repair  THERAPY DIAG:  Scar condition and fibrosis of skin  Stiffness of right hand, not elsewhere classified  Stiffness of right wrist, not elsewhere classified  Muscle weakness (generalized)  Rationale for Evaluation and Treatment: Rehabilitation  SUBJECTIVE:   SUBJECTIVE STATEMENT: I am so sorry I am late - I had car trouble today  Pt accompanied by: self  PERTINENT HISTORY: 02/14/24: Left upper Extremity: Incision is clean, dry, intact. No significant erythema, drainage, or other signs of infection. Sutures are in place. Sensation is intact to light touch to the radial and ulnar aspects of the thumb. The tip of the thumb is warm and well-perfused with brisk cap refill. I did not ask him to demonstrate range of motion of the thumb or actively extended but he is holding the thumb extended on examination.  Imaging and Results: NA  Assessment & Plan: Jordan Moreno is a 33 y.o. male patient who is  s/p left EPL laceration repair on 02/01/2024 - Sutures removed in clinic today - New thumb spica splint placed today - Referral to occupational therapy for custom orthosis, tendon repair protocol - Follow-up in 4 weeks   PRECAUTIONS: Orthopedic orders- splint on and NWB or use of R hand    WEIGHT BEARING RESTRICTIONS: NWB  PAIN:  Are you having pain? No pain reported  FALLS: Has patient fallen in last 6 months? 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  Likes to workout in his free time and spend time with his family.  Patient has a 10-week newborn baby  PATIENT GOALS: Want to get this thumb better so that I can get back to work and use it to work out and do things and help with my daughter  NEXT MD VISIT: 03/15/24  OBJECTIVE:  Note: Objective measures were completed at Evaluation unless otherwise noted.  HAND DOMINANCE: Right  ADLs: Unable to use her right hand-mostly thumb with no gripping no pinching or pulling-splint on all the time  FUNCTIONAL OUTCOME MEASURES: PRWHE pain and function   UPPER EXTREMITY ROM:     Active ROM Right eval Left 02/29/24  Shoulder flexion    Shoulder abduction    Shoulder adduction    Shoulder extension    Shoulder internal rotation    Shoulder external rotation    Elbow  flexion    Elbow extension    Wrist flexion  40  Wrist extension  50  Wrist ulnar deviation  35  Wrist radial deviation  25  Wrist pronation  90  Wrist supination  90  (Blank rows = not tested)  Active ROM Right eval Left 02/29/24  Thumb MCP (0-60)    Thumb IP (0-80)  18  Thumb Radial abd/add (0-55)  54   Thumb Palmar abd/add (0-45)  55   Thumb Opposition to Small Finger     Index MCP (0-90)     Index PIP (0-100)     Index DIP (0-70)      Long MCP (0-90)      Long PIP (0-100)      Long DIP (0-70)      Ring MCP (0-90)      Ring PIP (0-100)      Ring DIP (0-70)      Little MCP (0-90)       Little PIP (0-100)      Little DIP (0-70)      (Blank rows = not tested) Able to make composite fist but stiffness reported in composite flexion  HAND FUNCTION: Grip strength: Right:   lbs; Left:   lbs, Lateral pinch: Right:   lbs, Left:   lbs, and 3 point pinch: Right:   lbs, Left:   lbs  COORDINATION: Impaired thumb immobilized because of EPL repair  SENSATION: Patient report some sensation deficit or numbness in the tip of the thumb  EDEMA: Minimal edema noticed  COGNITION: Overall cognitive status: Within functional limits for tasks assessed        TREATMENT DATE: 02/29/24                                                                                                                                Pt arrive with his  custom forearm-based wrist and thumb static splint  with wrist in slight 10 to 20 degrees extension and thumb in slight radial abduction in place on L hand To protect repair.  With patient.  Remove splint.  Taken some measurements for wrist And thumb.  See flowsheet.  And applied moist heat for 4 minutes prior to review of home program  Patient to continue to wear splint  All the time on to protect repair.  In between home exercises Patient fitted with Isotoner glove to wear some during the day between exercise sessions but also at nighttime.  To decrease edema in the thumb Patient can do contrast or moist heat 4 times a day followed by gentle active range of motion - Wrist flexion extension 15-25 reps - Radial ulnar deviation 15-25 reps - Thumb palmar abduction and adduction 15-25 reps - Gentle thumb flexion and radial abduction 15-25 reps Slight pull pain-free 4 times a day Can also do gentle blocked IP flexion extension with MCP position and thumb flexion-focus more on extension than flexion.  Slight pull no pain.  Pt verbalized and demonstrated understanding.    PATIENT EDUCATION: Education details: findings of eval and HEP / splint  wearing Person educated: Patient Education method: Explanation, Demonstration, Tactile cues, Verbal cues, and Handouts Education comprehension: verbalized understanding, returned demonstration, verbal cues required, and needs further education      GOALS: Goals reviewed with patient? Yes   SHORT TERM GOALS: Target date: 2 weeks   Patient to tolerate wearing of splint and be independent in donning and doffing of protective static wrist and thumb splint to protect right EPL repair Baseline: Patient arrive soft cast.  Removed and fabricated custom forearm brace wrist and thumb static splint to protect repair Goal status: INITIAL     LONG TERM GOALS: Target date: 12 weeks      1.  Right wrist active range of motion improved to within normal range in all planes to use in bathing and petting her animals symptom-free Baseline: 13 days postop immobilized Goal status: INITIAL   2.  Right thumb palmar radial abduction improved to within normal limits for patient to be able to grasp a cup and turn doorknob symptom-free Baseline: 13 days postop immobilized to protect repair Goal status: INITIAL   3.  Right thumb flexion and opposition improved to within functional limits for patient to retrieve objects out of palm Baseline: 13 days postop immobilization of thumb Goal status: INITIAL   4.  Right thumb lateral 3-point pinch and grip improve within normal range for her age to be able to do buttons and open Ziploc bags and carry groceries symptom-free Baseline: Not tested Goal status: INITIAL   5.  PRWHE function score improve to less than 10/50 Baseline: 50/50 score for function on PRWHE Goal status: INITIAL    ASSESSMENT:  CLINICAL IMPRESSION: Patient seen today for occupational therapy follow-up for right EPL repair on 02/01/24.  Patient arrived with  custom forearm-based volar brace fabricated with digits free and thumb in slight radial abduction with IP in extension and wrist 20 to  30 degrees extension  in place.  Patient is 4 weeks postop.  Initiated some gentle active range of motion to wrist and thumb palmar radial abduction as well as flexion.  4 times a day 15-25 reps after moist heat or contrast.  Splint on in between all the time as well as nighttime.  Continue scar massage.  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  Patient can benefit from skilled OT services to decrease scar tissue increase motion and increase strength following in the [place]  hand protocol.  PERFORMANCE DEFICITS: in functional skills including ADLs, IADLs, ROM, strength, pain, flexibility, decreased knowledge of use of DME, and UE functional use,   and psychosocial skills including environmental adaptation and routines and behaviors.   IMPAIRMENTS: are limiting patient from ADLs, IADLs, rest and sleep, play, leisure, and social participation.   COMORBIDITIES: has no other co-morbidities that affects occupational performance. Patient will benefit from skilled OT to address above impairments and improve overall function.  MODIFICATION OR ASSISTANCE TO COMPLETE EVALUATION: No modification of tasks or assist necessary to complete an evaluation.  OT OCCUPATIONAL PROFILE AND HISTORY: Problem focused assessment: Including review of records relating to presenting problem.  CLINICAL DECISION MAKING: LOW - limited treatment options, no task modification necessary  REHAB POTENTIAL: Good for goals  EVALUATION COMPLEXITY: Low     PLAN:  OT FREQUENCY: 1-2x/week  OT DURATION: 10 weeks  PLANNED INTERVENTIONS: 97168 OT Re-evaluation, 97535 self care/ADL training, 02889 therapeutic exercise, 97530 therapeutic activity, 97112 neuromuscular re-education, 97140 manual therapy, 97018 paraffin, 02960 fluidotherapy, 97034 contrast bath, 97760 Orthotic Initial, [id number] Orthotic/Prosthetic subsequent, scar mobilization, passive  range of motion, patient/family education, and DME and/or AE instructions    CONSULTED AND AGREED WITH PLAN OF CARE: Patient     Ancel Peters, OTR/L,CLT 02/29/2024, 10:03 AM

## 2024-03-07 ENCOUNTER — Ambulatory Visit: Admitting: Occupational Therapy

## 2024-03-07 DIAGNOSIS — L905 Scar conditions and fibrosis of skin: Secondary | ICD-10-CM | POA: Diagnosis not present

## 2024-03-07 DIAGNOSIS — M25641 Stiffness of right hand, not elsewhere classified: Secondary | ICD-10-CM

## 2024-03-07 DIAGNOSIS — M25631 Stiffness of right wrist, not elsewhere classified: Secondary | ICD-10-CM

## 2024-03-07 DIAGNOSIS — M6281 Muscle weakness (generalized): Secondary | ICD-10-CM

## 2024-03-07 NOTE — Therapy (Signed)
 OUTPATIENT OCCUPATIONAL THERAPY ORTHO TREATMENT  Patient Name: Jordan Moreno MRN: [id number] DOB:02-09-1991, 33 y.o., male Today's Date: 03/07/2024  PCP: No PCP REFERRING PROVIDER: Dr Ezra  END OF SESSION:  OT End of Session - 03/07/24 0824     Visit Number 4    Number of Visits 16    Date for Recertification  04/25/24    OT Start Time 0820    OT Stop Time 0907    OT Time Calculation (min) 47 min    Activity Tolerance Patient tolerated treatment well    Behavior During Therapy [facility] for tasks assessed/performed          Past Medical History:  Diagnosis Date   Hydrocele    Inguinal hernia    Past Surgical History:  Procedure Laterality Date   INGUINAL HERNIA REPAIR Right 2009   REPAIR EXTENSOR TENDON Left 02/01/2024   Procedure: REPAIR, TENDON, EXTENSOR;  Surgeon: [name], MD;  Location: [facility] ORS;  Service: Orthopedics;  Laterality: Left;   There are no active problems to display for this patient.   ONSET DATE: 02/01/24  REFERRING DIAG: R EPL repair  THERAPY DIAG:  Scar condition and fibrosis of skin  Stiffness of right hand, not elsewhere classified  Stiffness of right wrist, not elsewhere classified  Muscle weakness (generalized)  Rationale for Evaluation and Treatment: Rehabilitation  SUBJECTIVE:   SUBJECTIVE STATEMENT: I feel like I am doing little better.  I have been doing the exercises and still do scar massage. Pt accompanied by: self  PERTINENT HISTORY: 02/14/24: Left upper Extremity: Incision is clean, dry, intact. No significant erythema, drainage, or other signs of infection. Sutures are in place. Sensation is intact to light touch to the radial and ulnar aspects of the thumb. The tip of the thumb is warm and well-perfused with brisk cap refill. I did not ask him to demonstrate range of motion of the thumb or actively extended but he is holding the thumb extended on examination.  Imaging and Results: NA  Assessment &  Plan: Jordan Moreno is a 33 y.o. male patient who is s/p left EPL laceration repair on 02/01/2024 - Sutures removed in clinic today - New thumb spica splint placed today - Referral to occupational therapy for custom orthosis, tendon repair protocol - Follow-up in 4 weeks   PRECAUTIONS: Orthopedic orders- splint on and NWB or use of R hand    WEIGHT BEARING RESTRICTIONS: NWB  PAIN:  Are you having pain? No pain reported  FALLS: Has patient fallen in last 6 months? 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  Likes to workout in his free time and spend time with his family.  Patient has a 10-week newborn baby  PATIENT GOALS: Want to get this thumb better so that I can get back to work and use it to work out and do things and help with my daughter  NEXT MD VISIT: 03/15/24  OBJECTIVE:  Note: Objective measures were completed at Evaluation unless otherwise noted.  HAND DOMINANCE: Right  ADLs: Unable to use her right hand-mostly thumb with no gripping no pinching or pulling-splint on all the time  FUNCTIONAL OUTCOME MEASURES: PRWHE pain and function   UPPER EXTREMITY ROM:     Active ROM Right eval Left 02/29/24 L 03/07/24  Shoulder flexion     Shoulder abduction     Shoulder adduction     Shoulder extension     Shoulder  internal rotation     Shoulder external rotation     Elbow flexion     Elbow extension     Wrist flexion  40 75  Wrist extension  50 68  Wrist ulnar deviation  35 35  Wrist radial deviation  25 25  Wrist pronation  90 90  Wrist supination  90 90  (Blank rows = not tested)  Active ROM Right eval Left 02/29/24 L 03/07/24  Thumb MCP (0-60)   30  Thumb IP (0-80)  18 20  Thumb Radial abd/add (0-55)  54  60  Thumb Palmar abd/add (0-45)  55  60  Thumb Opposition to Small Finger    Opposition to all digits with decreased flexion out of MCP IP  Index MCP (0-90)      Index PIP (0-100)       Index DIP (0-70)       Long MCP (0-90)       Long PIP (0-100)       Long DIP (0-70)       Ring MCP (0-90)       Ring PIP (0-100)       Ring DIP (0-70)       Little MCP (0-90)       Little PIP (0-100)       Little DIP (0-70)       (Blank rows = not tested) Able to make composite fist but stiffness reported in composite flexion  HAND FUNCTION: Grip strength: Right:   lbs; Left:   lbs, Lateral pinch: Right:   lbs, Left:   lbs, and 3 point pinch: Right:   lbs, Left:   lbs  COORDINATION: Impaired thumb immobilized because of EPL repair  SENSATION: Patient report some sensation deficit or numbness in the tip of the thumb  EDEMA: Minimal edema noticed  COGNITION: Overall cognitive status: Within functional limits for tasks assessed        TREATMENT DATE: 03/07/24                                                                                                                                Pt arrive with his  custom forearm-based wrist and thumb static splint  with wrist in slight 10 to 20 degrees extension and thumb in slight radial abduction in place on L hand To protect repair.  With patient.  Remove splint.  Assess progress with wrist and thumb active range of motion see flowsheet   Patient to continue to wear splint -but can remove it and keep it off for about an hour after each exercise session for light activities less than a pound but no sustained pinch or grip.  Patient can continue with Isotoner glove  at nighttime.  To decrease edema in the thumb Patient can do contrast or moist heat 4 times a day followed by gentle active range of motion - Patient can do some light active assisted  range of motion for wrist flexion extension 15-25 reps - AAROM radial ulnar deviation 15-25 reps - Thumb palmar abduction and adduction 15-25 reps - Blocked thumb MCP and IP flexion with active assisted range of motion and extension for each 12 reps prior to - Opposition to all digits  focusing on oval with thumb extension in between-active assisted range for IP Slight pull or pain-free 4 times a day  Patient to continue with scar massage 3 times a day as well as Cica -Care scar pad at nighttime Pt verbalized and demonstrated understanding.    PATIENT EDUCATION: Education details: findings of eval and HEP / splint wearing Person educated: Patient Education method: Explanation, Demonstration, Tactile cues, Verbal cues, and Handouts Education comprehension: verbalized understanding, returned demonstration, verbal cues required, and needs further education      GOALS: Goals reviewed with patient? Yes   SHORT TERM GOALS: Target date: 2 weeks   Patient to tolerate wearing of splint and be independent in donning and doffing of protective static wrist and thumb splint to protect right EPL repair Baseline: Patient arrive soft cast.  Removed and fabricated custom forearm brace wrist and thumb static splint to protect repair Goal status: INITIAL     LONG TERM GOALS: Target date: 12 weeks      1.  Right wrist active range of motion improved to within normal range in all planes to use in bathing and petting her animals symptom-free Baseline: 13 days postop immobilized Goal status: INITIAL   2.  Right thumb palmar radial abduction improved to within normal limits for patient to be able to grasp a cup and turn doorknob symptom-free Baseline: 13 days postop immobilized to protect repair Goal status: INITIAL   3.  Right thumb flexion and opposition improved to within functional limits for patient to retrieve objects out of palm Baseline: 13 days postop immobilization of thumb Goal status: INITIAL   4.  Right thumb lateral 3-point pinch and grip improve within normal range for her age to be able to do buttons and open Ziploc bags and carry groceries symptom-free Baseline: Not tested Goal status: INITIAL   5.  PRWHE function score improve to less than 10/50 Baseline:  50/50 score for function on PRWHE Goal status: INITIAL    ASSESSMENT:  CLINICAL IMPRESSION: Patient seen today for occupational therapy follow-up for right EPL repair on 02/01/24.  Patient arrived with  custom forearm-based volar brace fabricated with digits free and thumb in slight radial abduction with IP in extension and wrist 20 to 30 degrees extension  in place.   NOW patient arrived with increased wrist and thumb active range of motion.  Initiated some active assisted range of motion for wrist in all planes.  As well as initiating thumb IP and MCP flexion prior to opposition with a focus on extension.  Patient can remove splint for about an hour after each session 3-4 times a day for light activities.  Home exercises   4 times a day 15-25 reps after moist heat or contrast.  Splint on in between home exercises sessions at nighttime continue scar massage.  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  Patient can benefit from skilled OT services to decrease scar tissue increase motion and increase strength following in the [place]  hand protocol.  PERFORMANCE DEFICITS: in functional skills including ADLs, IADLs, ROM, strength, pain, flexibility, decreased knowledge of use of DME, and  UE functional use,   and psychosocial skills including environmental adaptation and routines and behaviors.   IMPAIRMENTS: are limiting patient from ADLs, IADLs, rest and sleep, play, leisure, and social participation.   COMORBIDITIES: has no other co-morbidities that affects occupational performance. Patient will benefit from skilled OT to address above impairments and improve overall function.  MODIFICATION OR ASSISTANCE TO COMPLETE EVALUATION: No modification of tasks or assist necessary to complete an evaluation.  OT OCCUPATIONAL PROFILE AND HISTORY: Problem focused assessment: Including review of records relating to presenting  problem.  CLINICAL DECISION MAKING: LOW - limited treatment options, no task modification necessary  REHAB POTENTIAL: Good for goals  EVALUATION COMPLEXITY: Low     PLAN:  OT FREQUENCY: 1-2x/week  OT DURATION: 10 weeks  PLANNED INTERVENTIONS: 97168 OT Re-evaluation, 97535 self care/ADL training, 02889 therapeutic exercise, 97530 therapeutic activity, 97112 neuromuscular re-education, 97140 manual therapy, 97018 paraffin, 02960 fluidotherapy, 97034 contrast bath, 97760 Orthotic Initial, [id number] Orthotic/Prosthetic subsequent, scar mobilization, passive range of motion, patient/family education, and DME and/or AE instructions    CONSULTED AND AGREED WITH PLAN OF CARE: Patient     Ancel Peters, OTR/L,CLT 03/07/2024, 12:13 PM

## 2024-03-14 ENCOUNTER — Ambulatory Visit: Admitting: Occupational Therapy

## 2024-03-14 DIAGNOSIS — L905 Scar conditions and fibrosis of skin: Secondary | ICD-10-CM | POA: Diagnosis not present

## 2024-03-14 DIAGNOSIS — M25631 Stiffness of right wrist, not elsewhere classified: Secondary | ICD-10-CM

## 2024-03-14 DIAGNOSIS — M6281 Muscle weakness (generalized): Secondary | ICD-10-CM

## 2024-03-14 DIAGNOSIS — M25641 Stiffness of right hand, not elsewhere classified: Secondary | ICD-10-CM

## 2024-03-14 NOTE — Therapy (Signed)
 " OUTPATIENT OCCUPATIONAL THERAPY ORTHO TREATMENT  Patient Name: Jordan Moreno MRN: [id number] DOB:23-Aug-1990, 33 y.o., male Today's Date: 03/14/2024  PCP: No PCP REFERRING PROVIDER: Dr Ezra  END OF SESSION:  OT End of Session - 03/14/24 0733     Visit Number 5    Number of Visits 16    Date for Recertification  04/25/24    OT Start Time 0733    OT Stop Time 0815    OT Time Calculation (min) 42 min    Activity Tolerance Patient tolerated treatment well    Behavior During Therapy [facility] for tasks assessed/performed          Past Medical History:  Diagnosis Date   Hydrocele    Inguinal hernia    Past Surgical History:  Procedure Laterality Date   INGUINAL HERNIA REPAIR Right 2009   REPAIR EXTENSOR TENDON Left 02/01/2024   Procedure: REPAIR, TENDON, EXTENSOR;  Surgeon: [name], MD;  Location: [facility] ORS;  Service: Orthopedics;  Laterality: Left;   There are no active problems to display for this patient.   ONSET DATE: 02/01/24  REFERRING DIAG: R EPL repair  THERAPY DIAG:  Scar condition and fibrosis of skin  Stiffness of right hand, not elsewhere classified  Stiffness of right wrist, not elsewhere classified  Muscle weakness (generalized)  Rationale for Evaluation and Treatment: Rehabilitation  SUBJECTIVE:   SUBJECTIVE STATEMENT: I am doing good- little stiff this am - but doing my exercise- when can I change my daughter's diapers , and go back to the gym - can I do things in the pool  Pt accompanied by: self  PERTINENT HISTORY: 02/14/24: Left upper Extremity: Incision is clean, dry, intact. No significant erythema, drainage, or other signs of infection. Sutures are in place. Sensation is intact to light touch to the radial and ulnar aspects of the thumb. The tip of the thumb is warm and well-perfused with brisk cap refill. I did not ask him to demonstrate range of motion of the thumb or actively extended but he is holding the thumb extended on  examination.  Imaging and Results: NA  Assessment & Plan: Jordan Moreno is a 33 y.o. male patient who is s/p left EPL laceration repair on 02/01/2024 - Sutures removed in clinic today - New thumb spica splint placed today - Referral to occupational therapy for custom orthosis, tendon repair protocol - Follow-up in 4 weeks   PRECAUTIONS: Orthopedic orders- splint on and NWB or use of R hand    WEIGHT BEARING RESTRICTIONS: NWB  PAIN:  Are you having pain? No pain reported  FALLS: Has patient fallen in last 6 months? 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  Likes to workout in his free time and spend time with his family.  Patient has a 10-week newborn baby  PATIENT GOALS: Want to get this thumb better so that I can get back to work and use it to work out and do things and help with my daughter  NEXT MD VISIT: 03/15/24  OBJECTIVE:  Note: Objective measures were completed at Evaluation unless otherwise noted.  HAND DOMINANCE: Right  ADLs: Unable to use her right hand-mostly thumb with no gripping no pinching or pulling-splint on all the time  FUNCTIONAL OUTCOME MEASURES: PRWHE pain and function   UPPER EXTREMITY ROM:     Active ROM Right eval Left 02/29/24 L 03/07/24 L 03/14/24  Shoulder flexion  Shoulder abduction      Shoulder adduction      Shoulder extension      Shoulder internal rotation      Shoulder external rotation      Elbow flexion      Elbow extension      Wrist flexion  40 75 90  Wrist extension  50 68 80  Wrist ulnar deviation  35 35 35  Wrist radial deviation  25 25 25   Wrist pronation  90 90 90  Wrist supination  90 90 90  (Blank rows = not tested)  Active ROM Right eval Left 02/29/24 L 03/07/24 L  Thumb MCP (0-60)   30 45  Thumb IP (0-80)  18 20 38/ -15 ext  Thumb Radial abd/add (0-55)  54  60 55  Thumb Palmar abd/add (0-45)  55  60 60  Thumb Opposition to Small  Finger    Opposition to all digits with decreased flexion out of MCP IP Opposition to 2nd fold of 5th   Index MCP (0-90)       Index PIP (0-100)       Index DIP (0-70)        Long MCP (0-90)        Long PIP (0-100)        Long DIP (0-70)        Ring MCP (0-90)        Ring PIP (0-100)        Ring DIP (0-70)        Little MCP (0-90)        Little PIP (0-100)        Little DIP (0-70)        (Blank rows = not tested) Able to make composite fist but stiffness reported in composite flexion  HAND FUNCTION: Grip strength: Right:   lbs; Left:   lbs, Lateral pinch: Right:   lbs, Left:   lbs, and 3 point pinch: Right:   lbs, Left:   lbs  COORDINATION: Impaired thumb immobilized because of EPL repair  SENSATION: Patient report some sensation deficit or numbness in the tip of the thumb  EDEMA: Minimal edema noticed  COGNITION: Overall cognitive status: Within functional limits for tasks assessed        TREATMENT DATE: 03/14/24                                                                                                                                Pt arrive with his  custom forearm-based wrist and thumb static splint  with wrist in slight 10 to 20 degrees extension and thumb in slight radial abduction in place on L hand To protect repair.   Patient is 6 weeks postop today. Reviewed with patient that can decrease him to only thumb static orthosis, wrist can be free Patient can leave of the thumb orthosis for 3 one  hour sessions of after home exercises  during the day for light ADLs. Reviewed with patient light ADL activities.  Patient in fluidotherapy for 8 minutes performing active range of motion for wrist and thumb in all planes to decrease stiffness increase motion. Followed by fabricate for patient a custom hand-based thumb orthosis with slight hyperextension for the thumb IP.  Patient to wear it during the day and night.  Can remove it for 3 one hour sessions a day for  light ADLs. Patient was educated on donning and doffing as well as wearing.  Patient continues to have some scar adhesions on the dorsal thumb.  Done some scar massage and mobilization with patient Did educate patient and applied some Kinesiotape at 100% pull with X over the scar to facilitate some scar mobilization during the day with active range of motion during home exercises Patient can do scar massage over the tape. Continue at nighttime for Cica -Care scar pad.   Patient can continue with Isotoner glove  at nighttime.  To decrease edema in the thum as needed Patient can continue contrast or moist heat 4 times a day followed by - Light active assisted range/passive range of motion wrist flexion extension 15-25 reps - AAROM radial ulnar deviation 15-25 reps - Thumb palmar abduction and adduction 15-25 reps  ADD I reviewed with patient -Blocked passive range of motion for thumb IP with extension in between 12  reps -Blocked passive range of motion for thumb MC with extension in between 12  reps - Followed by blocked thumb MCP and IP flexion  with extension endrange for each 12 reps prior to - Opposition to all digits focusing on oval with thumb active assisted range for IP extension-sliding down the 4th and 5th Slight pull or pain-free 4 times a day  Patient to continue with scar massage 3 times a day as well as Cica -Care scar pad at nighttime Pt verbalized and demonstrated understanding.    PATIENT EDUCATION: Education details: findings of eval and HEP / splint wearing Person educated: Patient Education method: Explanation, Demonstration, Tactile cues, Verbal cues, and Handouts Education comprehension: verbalized understanding, returned demonstration, verbal cues required, and needs further education      GOALS: Goals reviewed with patient? Yes   SHORT TERM GOALS: Target date: 2 weeks   Patient to tolerate wearing of splint and be independent in donning and doffing of  protective static wrist and thumb splint to protect right EPL repair Baseline: Patient arrive soft cast.  Removed and fabricated custom forearm brace wrist and thumb static splint to protect repair Goal status: INITIAL     LONG TERM GOALS: Target date: 12 weeks      1.  Right wrist active range of motion improved to within normal range in all planes to use in bathing and petting her animals symptom-free Baseline: 13 days postop immobilized Goal status: INITIAL   2.  Right thumb palmar radial abduction improved to within normal limits for patient to be able to grasp a cup and turn doorknob symptom-free Baseline: 13 days postop immobilized to protect repair Goal status: INITIAL   3.  Right thumb flexion and opposition improved to within functional limits for patient to retrieve objects out of palm Baseline: 13 days postop immobilization of thumb Goal status: INITIAL   4.  Right thumb lateral 3-point pinch and grip improve within normal range for her age to be able to do buttons and open Ziploc bags and carry groceries symptom-free Baseline: Not tested Goal status: INITIAL   5.  PRWHE function score improve to less than 10/50 Baseline: 50/50 score for function on PRWHE Goal status: INITIAL    ASSESSMENT:  CLINICAL IMPRESSION: Patient seen today for occupational therapy follow-up for right EPL repair on 02/01/24.  Patient is today 6 weeks postop patient arrived with  custom forearm-based volar brace fabricated with digits free and thumb in slight radial abduction with IP in extension and wrist 20 to 30 degrees extension  in place.   NOW fabricated for patient to hand-based custom thumb orthosis with slight hyperextension for the IP.  Patient to wear day and night except of her exercises and can remove it three 1 hour sessions a day to perform light ADLs.  Reviewed with patient light ADL activities.  As well as at this date gentle passive range of motion for blocked IP flexion with  extension in between as well as MCP passive range of motion blocked with extension in between.  Prior to blocked active range of motion and opposition.  Patient can continue with wrist exercises as well as thumb palmar radial abduction. Home exercises   4 times a day 15-25 reps after moist heat or contrast.  Continue to focus on scar massage and mobilization.  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  Patient can benefit from skilled OT services to decrease scar tissue increase motion and increase strength following in the [place]  hand protocol.  PERFORMANCE DEFICITS: in functional skills including ADLs, IADLs, ROM, strength, pain, flexibility, decreased knowledge of use of DME, and UE functional use,   and psychosocial skills including environmental adaptation and routines and behaviors.   IMPAIRMENTS: are limiting patient from ADLs, IADLs, rest and sleep, play, leisure, and social participation.   COMORBIDITIES: has no other co-morbidities that affects occupational performance. Patient will benefit from skilled OT to address above impairments and improve overall function.  MODIFICATION OR ASSISTANCE TO COMPLETE EVALUATION: No modification of tasks or assist necessary to complete an evaluation.  OT OCCUPATIONAL PROFILE AND HISTORY: Problem focused assessment: Including review of records relating to presenting problem.  CLINICAL DECISION MAKING: LOW - limited treatment options, no task modification necessary  REHAB POTENTIAL: Good for goals  EVALUATION COMPLEXITY: Low     PLAN:  OT FREQUENCY: 1-2x/week  OT DURATION: 10 weeks  PLANNED INTERVENTIONS: 97168 OT Re-evaluation, 97535 self care/ADL training, 02889 therapeutic exercise, 97530 therapeutic activity, 97112 neuromuscular re-education, 97140 manual therapy, 97018 paraffin, 02960 fluidotherapy, 97034 contrast bath, 97760 Orthotic Initial, [id number]  Orthotic/Prosthetic subsequent, scar mobilization, passive range of motion, patient/family education, and DME and/or AE instructions    CONSULTED AND AGREED WITH PLAN OF CARE: Patient     Jordan Moreno, OTR/L,CLT 03/14/2024, 11:14 AM   "

## 2024-03-21 ENCOUNTER — Ambulatory Visit: Admitting: Occupational Therapy

## 2024-03-21 DIAGNOSIS — M25631 Stiffness of right wrist, not elsewhere classified: Secondary | ICD-10-CM

## 2024-03-21 DIAGNOSIS — M25641 Stiffness of right hand, not elsewhere classified: Secondary | ICD-10-CM

## 2024-03-21 DIAGNOSIS — L905 Scar conditions and fibrosis of skin: Secondary | ICD-10-CM

## 2024-03-21 DIAGNOSIS — M6281 Muscle weakness (generalized): Secondary | ICD-10-CM

## 2024-03-21 NOTE — Therapy (Signed)
 " OUTPATIENT OCCUPATIONAL THERAPY ORTHO TREATMENT  Patient Name: Jordan Moreno MRN: [id number] DOB:06/17/1990, 33 y.o., male Today's Date: 03/21/2024  PCP: No PCP REFERRING PROVIDER: Dr Ezra  END OF SESSION:  OT End of Session - 03/21/24 0732     Visit Number 6    Number of Visits 16    Date for Recertification  04/25/24    OT Start Time 0732    OT Stop Time 0810    OT Time Calculation (min) 38 min    Activity Tolerance Patient tolerated treatment well    Behavior During Therapy [facility] for tasks assessed/performed          Past Medical History:  Diagnosis Date   Hydrocele    Inguinal hernia    Past Surgical History:  Procedure Laterality Date   INGUINAL HERNIA REPAIR Right 2009   REPAIR EXTENSOR TENDON Left 02/01/2024   Procedure: REPAIR, TENDON, EXTENSOR;  Surgeon: [name], MD;  Location: [facility] ORS;  Service: Orthopedics;  Laterality: Left;   There are no active problems to display for this patient.   ONSET DATE: 02/01/24  REFERRING DIAG: R EPL repair  THERAPY DIAG:  Scar condition and fibrosis of skin  Stiffness of right hand, not elsewhere classified  Stiffness of right wrist, not elsewhere classified  Muscle weakness (generalized)  Rationale for Evaluation and Treatment: Rehabilitation  SUBJECTIVE:   SUBJECTIVE STATEMENT: The scar still tender when I work it - and this morning thumb stiff- still wearing the thumb splint and wrist splint at night time and heavy act Pt accompanied by: self  PERTINENT HISTORY: 02/14/24: Left upper Extremity: Incision is clean, dry, intact. No significant erythema, drainage, or other signs of infection. Sutures are in place. Sensation is intact to light touch to the radial and ulnar aspects of the thumb. The tip of the thumb is warm and well-perfused with brisk cap refill. I did not ask him to demonstrate range of motion of the thumb or actively extended but he is holding the thumb extended on  examination.  Imaging and Results: NA  Assessment & Plan: Vaun Hyndman is a 33 y.o. male patient who is s/p left EPL laceration repair on 02/01/2024 - Sutures removed in clinic today - New thumb spica splint placed today - Referral to occupational therapy for custom orthosis, tendon repair protocol - Follow-up in 4 weeks   PRECAUTIONS: Orthopedic orders- splint on and NWB or use of R hand    WEIGHT BEARING RESTRICTIONS: NWB  PAIN:  Are you having pain?tenderness over scar with scar massage 4/10   FALLS: Has patient fallen in last 6 months? 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  Likes to workout in his free time and spend time with his family.  Patient has a 10-week newborn baby  PATIENT GOALS: Want to get this thumb better so that I can get back to work and use it to work out and do things and help with my daughter  NEXT MD VISIT: 03/15/24  OBJECTIVE:  Note: Objective measures were completed at Evaluation unless otherwise noted.  HAND DOMINANCE: Right  ADLs: Unable to use her right hand-mostly thumb with no gripping no pinching or pulling-splint on all the time  FUNCTIONAL OUTCOME MEASURES: PRWHE pain and function   UPPER EXTREMITY ROM:     Active ROM Right eval Left 02/29/24 L 03/07/24 L 03/14/24  Shoulder flexion      Shoulder abduction  Shoulder adduction      Shoulder extension      Shoulder internal rotation      Shoulder external rotation      Elbow flexion      Elbow extension      Wrist flexion  40 75 90  Wrist extension  50 68 80  Wrist ulnar deviation  35 35 35  Wrist radial deviation  25 25 25   Wrist pronation  90 90 90  Wrist supination  90 90 90  (Blank rows = not tested)  Active ROM Right eval Left 02/29/24 L 03/07/24 L 12/30/25L   Thumb MCP (0-60)   30 45 40  Thumb IP (0-80)  18 20 38/ -15 ext 44/-15  End of session 50/-15  Thumb Radial abd/add (0-55)  54  60 55  55  Thumb Palmar abd/add (0-45)  55  60 60 60  Thumb Opposition to Small Finger    Opposition to all digits with decreased flexion out of MCP IP Opposition to 2nd fold of 5th  Opposition to 2nd base   Index MCP (0-90)        Index PIP (0-100)        Index DIP (0-70)         Long MCP (0-90)         Long PIP (0-100)         Long DIP (0-70)         Ring MCP (0-90)         Ring PIP (0-100)         Ring DIP (0-70)         Little MCP (0-90)         Little PIP (0-100)         Little DIP (0-70)         (Blank rows = not tested) Able to make composite fist but stiffness reported in composite flexion  HAND FUNCTION: Grip strength: Right:   lbs; Left:   lbs, Lateral pinch: Right:   lbs, Left:   lbs, and 3 point pinch: Right:   lbs, Left:   lbs  COORDINATION: Impaired thumb immobilized because of EPL repair  SENSATION: Patient report some sensation deficit or numbness in the tip of the thumb  EDEMA: Minimal edema noticed  COGNITION: Overall cognitive status: Within functional limits for tasks assessed        TREATMENT DATE: 03/20/24                                                                                                                                Pt arrive with his  custom  thumb static splint  with wrist in slight 10 to 20 degrees extension and thumb in slight radial abduction in place on L hand To protect repair.   Patient is 7 weeks postop today. Reviewed with patient to continue with thumb orthosis he had it off for about  an hour after each exercise session 3 times a day. Patient can increase of time out of thumb orthotics half hour a day over the next week as long as no extension lag increases at the thumb. No wrist orthosis anymore. Reviewed with patient light ADL activities that can be done in those times.  Paraffin done for moist deep heat prior to scar massage and review of passive range of motion stretches  for 8 minutes    Patient continues to have some  scar adhesions on the dorsal thumb.  Done some scar massage and mobilization with patient Use Coban for traction patient to continue scar massage 3 times a day. Provide patient with new Cica -Care scar pad for nighttime Continue at nighttime for Cica -Care scar pad.  This date Coban wrap thumb in composite flexion with a block at [facility] 3 x 45 seconds With last 1 performing wrist flexion in combination with flexion wrap of thumb After each 45 minutes done some active assisted range of motion for composite IP MC thumb flexion with active assisted range of motion for extension endrange 10 reps Patient to perform the same exercises 3-4 times a day after heat and scar massage  Done some fine motor and dexterity and opposition using golf ball from palm t ofinger tips 10 reps   Then 2nd and 3rd <> palm l 4th and 5th <> palm 10 reps Three 1 cm objects patient to pick up using 2-point pinch to palm and retrieve using thumb to fingertips out of palm 5-8 reps Patient can do each session at the end of each session 3-4 times a day   Patient can continue with Isotoner glove  at nighttime.  To decrease edema in the thumb as needed Patient can continue contrast or moist heat 3-4 times a day followed by  Pt verbalized and demonstrated understanding.    PATIENT EDUCATION: Education details: findings of eval and HEP / splint wearing Person educated: Patient Education method: Explanation, Demonstration, Tactile cues, Verbal cues, and Handouts Education comprehension: verbalized understanding, returned demonstration, verbal cues required, and needs further education      GOALS: Goals reviewed with patient? Yes   SHORT TERM GOALS: Target date: 2 weeks   Patient to tolerate wearing of splint and be independent in donning and doffing of protective static wrist and thumb splint to protect right EPL repair Baseline: Patient arrive soft cast.  Removed and fabricated custom forearm brace wrist and thumb  static splint to protect repair Goal status: INITIAL     LONG TERM GOALS: Target date: 12 weeks      1.  Right wrist active range of motion improved to within normal range in all planes to use in bathing and petting her animals symptom-free Baseline: 13 days postop immobilized Goal status: INITIAL   2.  Right thumb palmar radial abduction improved to within normal limits for patient to be able to grasp a cup and turn doorknob symptom-free Baseline: 13 days postop immobilized to protect repair Goal status: INITIAL   3.  Right thumb flexion and opposition improved to within functional limits for patient to retrieve objects out of palm Baseline: 13 days postop immobilization of thumb Goal status: INITIAL   4.  Right thumb lateral 3-point pinch and grip improve within normal range for her age to be able to do buttons and open Ziploc bags and carry groceries symptom-free Baseline: Not tested Goal status: INITIAL   5.  PRWHE function score improve to less than 10/50  Baseline: 50/50 score for function on PRWHE Goal status: INITIAL    ASSESSMENT:  CLINICAL IMPRESSION: Patient seen today for occupational therapy follow-up for right EPL repair on 02/01/24.   NOW -patient is 7 weeks postop today.  Patient arrived with custom thumb orthosis with slight hyperextension for the IP that he has been wearing daily keeping him off of bed hour after each home exercise session 3-4 times a day.  Patient can stop wearing wrist splint at nighttime. Patient can start weaning out of the thumb splint however more over the next week.  If extension lag states the same.  Initiated composite flexion stretch for thumb IP MC using Coban after paraffin today.  With IP flexion improving to 50 degrees.  Patient able to maintain IP extension -15.  Reinforced with patient to perform active assistive range of motion for thumb IP extension in between each exercises.  Patient to perform flexion wrap 3-4 times a day after  moist heat 3-4 times for 45 seconds.  Followed by [facility] for flexion and extension, and opposition to all digits and second fold of fifth.  Add some dexterity exercises today.     Continue to focus on scar massage and mobilization.  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  Patient can benefit from skilled OT services to decrease scar tissue increase motion and increase strength following in the [place]  hand protocol.  PERFORMANCE DEFICITS: in functional skills including ADLs, IADLs, ROM, strength, pain, flexibility, decreased knowledge of use of DME, and UE functional use,   and psychosocial skills including environmental adaptation and routines and behaviors.   IMPAIRMENTS: are limiting patient from ADLs, IADLs, rest and sleep, play, leisure, and social participation.   COMORBIDITIES: has no other co-morbidities that affects occupational performance. Patient will benefit from skilled OT to address above impairments and improve overall function.  MODIFICATION OR ASSISTANCE TO COMPLETE EVALUATION: No modification of tasks or assist necessary to complete an evaluation.  OT OCCUPATIONAL PROFILE AND HISTORY: Problem focused assessment: Including review of records relating to presenting problem.  CLINICAL DECISION MAKING: LOW - limited treatment options, no task modification necessary  REHAB POTENTIAL: Good for goals  EVALUATION COMPLEXITY: Low     PLAN:  OT FREQUENCY: 1-2x/week  OT DURATION: 10 weeks  PLANNED INTERVENTIONS: 97168 OT Re-evaluation, 97535 self care/ADL training, 02889 therapeutic exercise, 97530 therapeutic activity, 97112 neuromuscular re-education, 97140 manual therapy, 97018 paraffin, 02960 fluidotherapy, 97034 contrast bath, 97760 Orthotic Initial, [id number] Orthotic/Prosthetic subsequent, scar mobilization, passive range of motion, patient/family education, and DME and/or AE  instructions    CONSULTED AND AGREED WITH PLAN OF CARE: Patient     Ancel Peters, OTR/L,CLT 03/21/2024, 8:13 AM   "

## 2024-03-28 ENCOUNTER — Ambulatory Visit: Payer: Self-pay | Admitting: Occupational Therapy

## 2024-03-28 DIAGNOSIS — M6281 Muscle weakness (generalized): Secondary | ICD-10-CM | POA: Insufficient documentation

## 2024-03-28 DIAGNOSIS — L905 Scar conditions and fibrosis of skin: Secondary | ICD-10-CM | POA: Insufficient documentation

## 2024-03-28 DIAGNOSIS — M25641 Stiffness of right hand, not elsewhere classified: Secondary | ICD-10-CM | POA: Insufficient documentation

## 2024-03-28 DIAGNOSIS — M25631 Stiffness of right wrist, not elsewhere classified: Secondary | ICD-10-CM | POA: Insufficient documentation

## 2024-03-28 NOTE — Therapy (Signed)
 " OUTPATIENT OCCUPATIONAL THERAPY ORTHO TREATMENT  Patient Name: Jordan Moreno MRN: [id number] DOB:08-08-1990, 34 y.o., male Today's Date: 03/28/2024  PCP: No PCP REFERRING PROVIDER: Dr Ezra  END OF SESSION:  OT End of Session - 03/28/24 0822     Visit Number 7    Number of Visits 16    Date for Recertification  04/25/24    OT Start Time 0820    OT Stop Time 0905    OT Time Calculation (min) 45 min    Activity Tolerance Patient tolerated treatment well    Behavior During Therapy [facility] for tasks assessed/performed          Past Medical History:  Diagnosis Date   Hydrocele    Inguinal hernia    Past Surgical History:  Procedure Laterality Date   INGUINAL HERNIA REPAIR Right 2009   REPAIR EXTENSOR TENDON Left 02/01/2024   Procedure: REPAIR, TENDON, EXTENSOR;  Surgeon: [name], MD;  Location: [facility] ORS;  Service: Orthopedics;  Laterality: Left;   There are no active problems to display for this patient.   ONSET DATE: 02/01/24  REFERRING DIAG: R EPL repair  THERAPY DIAG:  Scar condition and fibrosis of skin  Stiffness of right hand, not elsewhere classified  Stiffness of right wrist, not elsewhere classified  Muscle weakness (generalized)  Rationale for Evaluation and Treatment: Rehabilitation  SUBJECTIVE:   SUBJECTIVE STATEMENT: The scar still tender when I work it - and sore at times - but doing okay - wearing thumb splint about 25% of day and night time  Pt accompanied by: self  PERTINENT HISTORY: 02/14/24: Left upper Extremity: Incision is clean, dry, intact. No significant erythema, drainage, or other signs of infection. Sutures are in place. Sensation is intact to light touch to the radial and ulnar aspects of the thumb. The tip of the thumb is warm and well-perfused with brisk cap refill. I did not ask him to demonstrate range of motion of the thumb or actively extended but he is holding the thumb extended on examination.  Imaging and  Results: NA  Assessment & Plan: Jordan Moreno is a 34 y.o. male patient who is s/p left EPL laceration repair on 02/01/2024 - Sutures removed in clinic today - New thumb spica splint placed today - Referral to occupational therapy for custom orthosis, tendon repair protocol - Follow-up in 4 weeks   PRECAUTIONS: Orthopedic orders- splint on and NWB or use of R hand    WEIGHT BEARING RESTRICTIONS: NWB  PAIN:  Are you having pain?tenderness over scar with scar massage 4/10   FALLS: Has patient fallen in last 6 months? 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  Likes to workout in his free time and spend time with his family.  Patient has a 10-week newborn baby  PATIENT GOALS: Want to get this thumb better so that I can get back to work and use it to work out and do things and help with my daughter  NEXT MD VISIT: 03/15/24  OBJECTIVE:  Note: Objective measures were completed at Evaluation unless otherwise noted.  HAND DOMINANCE: Right  ADLs: Unable to use her right hand-mostly thumb with no gripping no pinching or pulling-splint on all the time  FUNCTIONAL OUTCOME MEASURES: PRWHE pain and function   UPPER EXTREMITY ROM:     Active ROM Right eval Left 02/29/24 L 03/07/24 L 03/14/24  Shoulder flexion      Shoulder abduction  Shoulder adduction      Shoulder extension      Shoulder internal rotation      Shoulder external rotation      Elbow flexion      Elbow extension      Wrist flexion  40 75 90  Wrist extension  50 68 80  Wrist ulnar deviation  35 35 35  Wrist radial deviation  25 25 25   Wrist pronation  90 90 90  Wrist supination  90 90 90  (Blank rows = not tested)  Active ROM Right eval Left 02/29/24 L 03/07/24 L 12/30/25L  L 03/28/24  Thumb MCP (0-60)   30 45 40 40  Thumb IP (0-80)  18 20 38/ -15 ext 44/-15  End of session 50/-15 40 , ext -10   Thumb Radial abd/add (0-55)  54  60 55  55 55  Thumb Palmar abd/add (0-45)  55  60 60 60 60  Thumb Opposition to Small Finger    Opposition to all digits with decreased flexion out of MCP IP Opposition to 2nd fold of 5th  Opposition to 2nd base  Opposition to 5th - 2nd fold  Index MCP (0-90)         Index PIP (0-100)         Index DIP (0-70)          Long MCP (0-90)          Long PIP (0-100)          Long DIP (0-70)          Ring MCP (0-90)          Ring PIP (0-100)          Ring DIP (0-70)          Little MCP (0-90)          Little PIP (0-100)          Little DIP (0-70)          (Blank rows = not tested) Able to make composite fist but stiffness reported in composite flexion  HAND FUNCTION: 03/28/24 Grip strength: Right: 85 lbs; Left: 72 lbs, Lateral pinch: Right: 22 lbs, Left: 17 lbs, and 3 point pinch: Right: 18 lbs, Left: 14 lbs  COORDINATION: Impaired thumb immobilized because of EPL repair  SENSATION: Patient report some sensation deficit or numbness in the tip of the thumb  EDEMA: Minimal edema noticed  COGNITION: Overall cognitive status: Within functional limits for tasks assessed        TREATMENT DATE:  03/28/24                                                                                                                                  Pt arrive with his  custom  thumb static splint on.  Reports he is wearing it about 25% of the time during the day and nighttime Active range of  motion for thumb assessment. Maintaining extension of IP at -10 Wrist active range of motion strength within normal range Grip and prehension strength assessed see flowsheet  Patient is 8 weeks postop today. Reviewed with patient no thumb orthosis on during the day anymore  By the end of the week can discharge the nighttime   Paraffin done for moist deep heat prior to scar massage and review of passive range of motion stretches  for 8 minutes    Patient continues to have some scar adhesions on the dorsal thumb.   Done some scar massage and mobilization with patient Use Coban for traction patient to continue scar massage 3 times a day. Provide patient with new Cica -Care scar pad for nighttime Continue at nighttime for Cica -Care scar pad. Pt to do scar massage 3 x day - decrease frequency  PROM and ext stretch to Thumb IP and MC flexion -as well as composite flexion of thumb  Prior to strengthening   Add for patient rubber band for thumb palmar radial abduction 15 reps for IP and MCP Putty teal medium for gripping, lumbrical fist pulling and twisting both ways like a knob 15 reps 2 pound weight for wrist and forearm in all planes 2 sets of 15  Patient can increase to a 2nd and 3rd set over the week symptom-free  Patient to continue with fine motor and dexterity and opposition using golf ball from palm t ofinger tips 10 reps   Then 2nd and 3rd <> palm l 4th and 5th <> palm 10 reps Three 1 cm objects patient to pick up using 2-point pinch to palm and retrieve using thumb to fingertips out of palm 5-8 reps Patient can do each session at the end of each session 3-4 times a day      PATIENT EDUCATION: Education details: findings of eval and HEP / splint wearing Person educated: Patient Education method: Explanation, Demonstration, Tactile cues, Verbal cues, and Handouts Education comprehension: verbalized understanding, returned demonstration, verbal cues required, and needs further education      GOALS: Goals reviewed with patient? Yes   SHORT TERM GOALS: Target date: 2 weeks   Patient to tolerate wearing of splint and be independent in donning and doffing of protective static wrist and thumb splint to protect right EPL repair Baseline: Patient arrive soft cast.  Removed and fabricated custom forearm brace wrist and thumb static splint to protect repair Goal status: INITIAL     LONG TERM GOALS: Target date: 12 weeks      1.  Right wrist active range of motion improved to within  normal range in all planes to use in bathing and petting her animals symptom-free Baseline: 13 days postop immobilized Goal status: INITIAL   2.  Right thumb palmar radial abduction improved to within normal limits for patient to be able to grasp a cup and turn doorknob symptom-free Baseline: 13 days postop immobilized to protect repair Goal status: INITIAL   3.  Right thumb flexion and opposition improved to within functional limits for patient to retrieve objects out of palm Baseline: 13 days postop immobilization of thumb Goal status: INITIAL   4.  Right thumb lateral 3-point pinch and grip improve within normal range for her age to be able to do buttons and open Ziploc bags and carry groceries symptom-free Baseline: Not tested Goal status: INITIAL   5.  PRWHE function score improve to less than 10/50 Baseline: 50/50 score for function on PRWHE Goal status: INITIAL  ASSESSMENT:  CLINICAL IMPRESSION: Patient seen today for occupational therapy follow-up for right EPL repair on 02/01/24.   NOW -patient is 7 weeks postop today.  Patient arrived with custom thumb orthosis with slight hyperextension for the IP that he has been wearing daily keeping him off of bed hour after each home exercise session 3-4 times a day.  Patient can stop wearing wrist splint at nighttime. Patient to discharge daytime wearing of thumb splint and wean out of night time wearing this week - cont to manitor for thumb IP extension lag states the same.  I Patient to perform flexion wrap 3-4 times a day after moist heat 3-4 times for 45 seconds.  Followed by [facility] for flexion and extension, and opposition to all digits and second fold of fifth.  Add some dexterity exercises last visit - this date initiate thumb PA and RA strengthening - at middle phalanges and IP - tolerate well - putty for grip , lumbrical fist , twisting - using thumb - tolerate well -as well as 2 lbs weight for wrist and forearm - upgrade to  increase 2nd and 3rd set this week    Continue to focus on scar massage and mobilization.  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  Patient can benefit from skilled OT services to decrease scar tissue increase motion and increase strength following in the [place]  hand protocol.  PERFORMANCE DEFICITS: in functional skills including ADLs, IADLs, ROM, strength, pain, flexibility, decreased knowledge of use of DME, and UE functional use,   and psychosocial skills including environmental adaptation and routines and behaviors.   IMPAIRMENTS: are limiting patient from ADLs, IADLs, rest and sleep, play, leisure, and social participation.   COMORBIDITIES: has no other co-morbidities that affects occupational performance. Patient will benefit from skilled OT to address above impairments and improve overall function.  MODIFICATION OR ASSISTANCE TO COMPLETE EVALUATION: No modification of tasks or assist necessary to complete an evaluation.  OT OCCUPATIONAL PROFILE AND HISTORY: Problem focused assessment: Including review of records relating to presenting problem.  CLINICAL DECISION MAKING: LOW - limited treatment options, no task modification necessary  REHAB POTENTIAL: Good for goals  EVALUATION COMPLEXITY: Low     PLAN:  OT FREQUENCY: 1-2x/week  OT DURATION: 10 weeks  PLANNED INTERVENTIONS: 97168 OT Re-evaluation, 97535 self care/ADL training, 02889 therapeutic exercise, 97530 therapeutic activity, 97112 neuromuscular re-education, 97140 manual therapy, 97018 paraffin, 02960 fluidotherapy, 97034 contrast bath, 97760 Orthotic Initial, [id number] Orthotic/Prosthetic subsequent, scar mobilization, passive range of motion, patient/family education, and DME and/or AE instructions    CONSULTED AND AGREED WITH PLAN OF CARE: Patient     Ancel Peters, OTR/L,CLT 03/28/2024, 1:08 PM   "

## 2024-04-04 ENCOUNTER — Ambulatory Visit: Payer: Self-pay | Admitting: Occupational Therapy

## 2024-04-04 DIAGNOSIS — M6281 Muscle weakness (generalized): Secondary | ICD-10-CM

## 2024-04-04 DIAGNOSIS — M25631 Stiffness of right wrist, not elsewhere classified: Secondary | ICD-10-CM

## 2024-04-04 DIAGNOSIS — M25641 Stiffness of right hand, not elsewhere classified: Secondary | ICD-10-CM

## 2024-04-04 DIAGNOSIS — L905 Scar conditions and fibrosis of skin: Secondary | ICD-10-CM

## 2024-04-04 NOTE — Therapy (Signed)
 " OUTPATIENT OCCUPATIONAL THERAPY ORTHO TREATMENT  Patient Name: Jordan Moreno MRN: [id number] DOB:1990-06-03, 34 y.o., male Today's Date: 04/04/2024  PCP: No PCP REFERRING PROVIDER: Dr Ezra  END OF SESSION:  OT End of Session - 04/04/24 0736     Visit Number 8    Number of Visits 16    Date for Recertification  04/25/24    OT Start Time 0735    OT Stop Time 0821    OT Time Calculation (min) 46 min    Activity Tolerance Patient tolerated treatment well    Behavior During Therapy [facility] for tasks assessed/performed          Past Medical History:  Diagnosis Date   Hydrocele    Inguinal hernia    Past Surgical History:  Procedure Laterality Date   INGUINAL HERNIA REPAIR Right 2009   REPAIR EXTENSOR TENDON Left 02/01/2024   Procedure: REPAIR, TENDON, EXTENSOR;  Surgeon: [name], MD;  Location: [facility] ORS;  Service: Orthopedics;  Laterality: Left;   There are no active problems to display for this patient.   ONSET DATE: 02/01/24  REFERRING DIAG: R EPL repair  THERAPY DIAG:  Scar condition and fibrosis of skin  Stiffness of right hand, not elsewhere classified  Stiffness of right wrist, not elsewhere classified  Muscle weakness (generalized)  Rationale for Evaluation and Treatment: Rehabilitation  SUBJECTIVE:   SUBJECTIVE STATEMENT: I am doing okay - working the scar and using it more- turning like tight doorknob with wrist and thumb little hard and think my flexibility is about the same  Pt accompanied by: self  PERTINENT HISTORY: 02/14/24: Left upper Extremity: Incision is clean, dry, intact. No significant erythema, drainage, or other signs of infection. Sutures are in place. Sensation is intact to light touch to the radial and ulnar aspects of the thumb. The tip of the thumb is warm and well-perfused with brisk cap refill. I did not ask him to demonstrate range of motion of the thumb or actively extended but he is holding the thumb extended on  examination.  Imaging and Results: NA  Assessment & Plan: Jordan Moreno is a 34 y.o. male patient who is s/p left EPL laceration repair on 02/01/2024 - Sutures removed in clinic today - New thumb spica splint placed today - Referral to occupational therapy for custom orthosis, tendon repair protocol - Follow-up in 4 weeks   PRECAUTIONS: Orthopedic orders- splint on and NWB or use of R hand    WEIGHT BEARING RESTRICTIONS: NWB  PAIN:  Are you having pain?no pain   FALLS: Has patient fallen in last 6 months? 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  Likes to workout in his free time and spend time with his family.  Patient has a 10-week newborn baby  PATIENT GOALS: Want to get this thumb better so that I can get back to work and use it to work out and do things and help with my daughter  NEXT MD VISIT: 03/15/24  OBJECTIVE:  Note: Objective measures were completed at Evaluation unless otherwise noted.  HAND DOMINANCE: Right  ADLs: Unable to use her right hand-mostly thumb with no gripping no pinching or pulling-splint on all the time  FUNCTIONAL OUTCOME MEASURES: PRWHE pain and function   UPPER EXTREMITY ROM:     Active ROM Right eval Left 02/29/24 L 03/07/24 L 03/14/24  Shoulder flexion      Shoulder abduction  Shoulder adduction      Shoulder extension      Shoulder internal rotation      Shoulder external rotation      Elbow flexion      Elbow extension      Wrist flexion  40 75 90  Wrist extension  50 68 80  Wrist ulnar deviation  35 35 35  Wrist radial deviation  25 25 25   Wrist pronation  90 90 90  Wrist supination  90 90 90  (Blank rows = not tested)  Active ROM Right eval Left 02/29/24 L 03/07/24 L 12/30/25L  L 03/28/24 L 04/04/24  Thumb MCP (0-60)   30 45 40 40 40  Thumb IP (0-80)  18 20 38/ -15 ext 44/-15  End of session 50/-15 40 , ext -10  45/ -10  Thumb Radial abd/add  (0-55)  54  60 55 55 55 55  Thumb Palmar abd/add (0-45)  55  60 60 60 60 60  Thumb Opposition to Small Finger    Opposition to all digits with decreased flexion out of MCP IP Opposition to 2nd fold of 5th  Opposition to 2nd base  Opposition to 5th - 2nd fold 2nd to 5th   Index MCP (0-90)          Index PIP (0-100)          Index DIP (0-70)           Long MCP (0-90)           Long PIP (0-100)           Long DIP (0-70)           Ring MCP (0-90)           Ring PIP (0-100)           Ring DIP (0-70)           Little MCP (0-90)           Little PIP (0-100)           Little DIP (0-70)           (Blank rows = not tested) Able to make composite fist but stiffness reported in composite flexion  HAND FUNCTION: 03/28/24 Grip strength: Right: 85 lbs; Left: 72 lbs, Lateral pinch: Right: 22 lbs, Left: 17 lbs, and 3 point pinch: Right: 18 lbs, Left: 14 lbs 04/04/24 Grip strength: Right: 85 lbs; Left: 78 lbs, Lateral pinch: Right: 22 lbs, Left: 16 lbs, and 3 point pinch: Right: 18 lbs, Left: 16 lbs COORDINATION: Impaired thumb immobilized because of EPL repair  SENSATION: Patient report some sensation deficit or numbness in the tip of the thumb  EDEMA: Minimal edema noticed  COGNITION: Overall cognitive status: Within functional limits for tasks assessed        TREATMENT DATE:  04/04/24  Patient has not been wearing any splints.   Active range of motion for thumb assessment. Maintaining extension of IP at -10 Wrist active range of motion strength within normal range Grip and prehension strength assessed see flowsheet  Patient is 9 weeks postop today.   Paraffin done for moist deep heat prior to scar massage and review of passive range of motion stretches  And Coban flexion wrap with gel blocking CMC MC with pressure over IP composite flexion for 8 minutes     Patient continues to have some scar adhesions on the dorsal thumb.  Done some scar massage and mobilization with patient Use Coban for traction patient to continue scar massage 3 times a day. As well as use of extractor Provide patient with new Cica -Care scar pad for nighttime Continue at nighttime for Cica -Care scar pad. Pt to do scar massage 3 x day   PROM and ext stretch to Thumb IP and MC flexion -as well as composite flexion of thumb  Reviewed with patient to continue with MCP IP combined flexion to base of fifth. Prior to strengthening   Patient to continue with rubber band for thumb palmar radial abduction 15 reps for IP and MCP Putty -upgrade to medium green putty for gripping, lumbrical fist pulling and twisting both ways like a knob(maintaining oval for lateral loading on thumb) Needed mod verbal cueing.  Tolerated well. 15 reps  Patient can increase to a 2nd and 3rd set over the week symptom-free  Patient also done flex bar 5 pounds for supination pronation as well as wrist flexion extension and radial ulnar deviation in combination with grip using and engaging thumb.  15 reps Needed mod verbal cueing.  Patient information provided to order on [employer] the 5 pounds in the 10 pounds. Patient can also increase to a 2nd or 3rd set over the rest of this week symptom-free   Patient to continue with fine motor and dexterity and opposition using golf ball from palm t ofinger tips 10 reps   Then 2nd and 3rd <> palm l 4th and 5th <> palm 10 reps Three 1 cm objects patient to pick up using 2-point pinch to palm and retrieve using thumb to fingertips out of palm 5-8 reps Patient can do each session at the end of each session 3-4 times a day      PATIENT EDUCATION: Education details: findings of eval and HEP / splint wearing Person educated: Patient Education method: Explanation, Demonstration, Tactile cues, Verbal cues, and Handouts Education comprehension: verbalized  understanding, returned demonstration, verbal cues required, and needs further education      GOALS: Goals reviewed with patient? Yes   SHORT TERM GOALS: Target date: 2 weeks   Patient to tolerate wearing of splint and be independent in donning and doffing of protective static wrist and thumb splint to protect right EPL repair Baseline: Patient arrive soft cast.  Removed and fabricated custom forearm brace wrist and thumb static splint to protect repair Goal status: INITIAL     LONG TERM GOALS: Target date: 12 weeks      1.  Right wrist active range of motion improved to within normal range in all planes to use in bathing and petting her animals symptom-free Baseline: 13 days postop immobilized Goal status: INITIAL   2.  Right thumb palmar radial abduction improved to within normal limits for patient to be able to grasp a cup and turn doorknob symptom-free Baseline: 13 days postop immobilized to protect repair Goal  status: INITIAL   3.  Right thumb flexion and opposition improved to within functional limits for patient to retrieve objects out of palm Baseline: 13 days postop immobilization of thumb Goal status: INITIAL   4.  Right thumb lateral 3-point pinch and grip improve within normal range for her age to be able to do buttons and open Ziploc bags and carry groceries symptom-free Baseline: Not tested Goal status: INITIAL   5.  PRWHE function score improve to less than 10/50 Baseline: 50/50 score for function on PRWHE Goal status: INITIAL    ASSESSMENT:  CLINICAL IMPRESSION: Patient seen today for occupational therapy follow-up for right EPL repair on 02/01/24.   NOW -patient is 9 weeks postop today.  Patient not wearing any splints.  Cont to monitor for thumb IP extension lag as increasing IP flexion and thumb composite flexion as well as strengthening. -IP extension same than last week.   Patient to perform flexion wrap 3-4 times a day after moist heat 3-4 times for  45 seconds.  Followed by [facility] for flexion and extension, and opposition to all digits and second fold of fifth.  Continue with dexterity exercises and thumb PA and RA strengthening - at middle phalanges and IP - tolerate well -upgrade patient to medium firm putty for gripping, lumbrical fist , twisting - using thumb - tolerate well upgrade patient's wrist and forearm weight to flex bar 5 pounds engaging thumb gripping with forearm wrist strengthening.  Tolerated well.  Patient can perform 15 reps 2-3 times a day and can upgrade to 2nd and 3rd set this week    Continue to focus on scar massage and mobilization.  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  Patient can benefit from skilled OT services to decrease scar tissue increase motion and increase strength following in the [place]  hand protocol.  PERFORMANCE DEFICITS: in functional skills including ADLs, IADLs, ROM, strength, pain, flexibility, decreased knowledge of use of DME, and UE functional use,   and psychosocial skills including environmental adaptation and routines and behaviors.   IMPAIRMENTS: are limiting patient from ADLs, IADLs, rest and sleep, play, leisure, and social participation.   COMORBIDITIES: has no other co-morbidities that affects occupational performance. Patient will benefit from skilled OT to address above impairments and improve overall function.  MODIFICATION OR ASSISTANCE TO COMPLETE EVALUATION: No modification of tasks or assist necessary to complete an evaluation.  OT OCCUPATIONAL PROFILE AND HISTORY: Problem focused assessment: Including review of records relating to presenting problem.  CLINICAL DECISION MAKING: LOW - limited treatment options, no task modification necessary  REHAB POTENTIAL: Good for goals  EVALUATION COMPLEXITY: Low     PLAN:  OT FREQUENCY: 1-2x/week  OT DURATION: 10 weeks  PLANNED INTERVENTIONS:  97168 OT Re-evaluation, 97535 self care/ADL training, 02889 therapeutic exercise, 97530 therapeutic activity, 97112 neuromuscular re-education, 97140 manual therapy, 97018 paraffin, 02960 fluidotherapy, 97034 contrast bath, 97760 Orthotic Initial, [id number] Orthotic/Prosthetic subsequent, scar mobilization, passive range of motion, patient/family education, and DME and/or AE instructions    CONSULTED AND AGREED WITH PLAN OF CARE: Patient     Ancel Peters, OTR/L,CLT 04/04/2024, 1:12 PM   "

## 2024-04-11 ENCOUNTER — Ambulatory Visit: Admitting: Occupational Therapy

## 2024-04-11 DIAGNOSIS — M25631 Stiffness of right wrist, not elsewhere classified: Secondary | ICD-10-CM

## 2024-04-11 DIAGNOSIS — L905 Scar conditions and fibrosis of skin: Secondary | ICD-10-CM

## 2024-04-11 DIAGNOSIS — M25641 Stiffness of right hand, not elsewhere classified: Secondary | ICD-10-CM

## 2024-04-11 DIAGNOSIS — M6281 Muscle weakness (generalized): Secondary | ICD-10-CM

## 2024-04-11 NOTE — Therapy (Signed)
 " OUTPATIENT OCCUPATIONAL THERAPY ORTHO TREATMENT  Patient Name: Jordan Moreno MRN: [id number] DOB:[birth year], 34 y.o., male Today's Date: 04/11/2024  PCP: No PCP REFERRING PROVIDER: Dr Ezra  END OF SESSION:  OT End of Session - 04/11/24 0904     Visit Number 9    Number of Visits 16    Date for Recertification  04/25/24    OT Start Time 0904    OT Stop Time 0945    OT Time Calculation (min) 41 min    Activity Tolerance Patient tolerated treatment well    Behavior During Therapy [facility] for tasks assessed/performed          Past Medical History:  Diagnosis Date   Hydrocele    Inguinal hernia    Past Surgical History:  Procedure Laterality Date   INGUINAL HERNIA REPAIR Right 2009   REPAIR EXTENSOR TENDON Left 02/01/2024   Procedure: REPAIR, TENDON, EXTENSOR;  Surgeon: [name], MD;  Location: [facility] ORS;  Service: Orthopedics;  Laterality: Left;   There are no active problems to display for this patient.   ONSET DATE: 02/01/24  REFERRING DIAG: R EPL repair  THERAPY DIAG:  Scar condition and fibrosis of skin  Stiffness of right hand, not elsewhere classified  Stiffness of right wrist, not elsewhere classified  Muscle weakness (generalized)  Rationale for Evaluation and Treatment: Rehabilitation  SUBJECTIVE:   SUBJECTIVE STATEMENT: I feel like sometimes my thumb is the same - I see it every day - but I am using it more  Pt accompanied by: self  PERTINENT HISTORY: 02/14/24: Left upper Extremity: Incision is clean, dry, intact. No significant erythema, drainage, or other signs of infection. Sutures are in place. Sensation is intact to light touch to the radial and ulnar aspects of the thumb. The tip of the thumb is warm and well-perfused with brisk cap refill. I did not ask him to demonstrate range of motion of the thumb or actively extended but he is holding the thumb extended on examination.  Imaging and Results: NA  Assessment & Plan: Jordan Moreno is a 34 y.o. male patient who is s/p left EPL laceration repair on 02/01/2024 - Sutures removed in clinic today - New thumb spica splint placed today - Referral to occupational therapy for custom orthosis, tendon repair protocol - Follow-up in 4 weeks   PRECAUTIONS: Orthopedic orders- splint on and NWB or use of R hand    WEIGHT BEARING RESTRICTIONS: NWB  PAIN:  Are you having pain?no pain   FALLS: Has patient fallen in last 6 months? 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  Likes to workout in his free time and spend time with his family.  Patient has a 10-week newborn baby  PATIENT GOALS: Want to get this thumb better so that I can get back to work and use it to work out and do things and help with my daughter  NEXT MD VISIT: 03/15/24  OBJECTIVE:  Note: Objective measures were completed at Evaluation unless otherwise noted.  HAND DOMINANCE: Right  ADLs: Unable to use her right hand-mostly thumb with no gripping no pinching or pulling-splint on all the time  FUNCTIONAL OUTCOME MEASURES: PRWHE pain and function   UPPER EXTREMITY ROM:     Active ROM Right eval Left 02/29/24 L 03/07/24 L 03/14/24  Shoulder flexion      Shoulder abduction      Shoulder adduction  Shoulder extension      Shoulder internal rotation      Shoulder external rotation      Elbow flexion      Elbow extension      Wrist flexion  40 75 90  Wrist extension  50 68 80  Wrist ulnar deviation  35 35 35  Wrist radial deviation  25 25 25   Wrist pronation  90 90 90  Wrist supination  90 90 90  (Blank rows = not tested)  Active ROM Right eval Left 02/29/24 L 03/07/24 L 12/30/25L  L 03/28/24 L 04/04/24 L 04/11/24  Thumb MCP (0-60)   30 45 40 40 40 35  Thumb IP (0-80)  18 20 38/ -15 ext 44/-15  End of session 50/-15 40 , ext -10  45/ -10 50/-10  Thumb Radial abd/add (0-55)  54  60 55 55 55 55   Thumb Palmar abd/add  (0-45)  55  60 60 60 60 60   Thumb Opposition to Small Finger    Opposition to all digits with decreased flexion out of MCP IP Opposition to 2nd fold of 5th  Opposition to 2nd base  Opposition to 5th - 2nd fold 2nd to 5th  2nd fold of 5th - in session to base of 5th   Index MCP (0-90)           Index PIP (0-100)           Index DIP (0-70)            Long MCP (0-90)            Long PIP (0-100)            Long DIP (0-70)            Ring MCP (0-90)            Ring PIP (0-100)            Ring DIP (0-70)            Little MCP (0-90)            Little PIP (0-100)            Little DIP (0-70)            (Blank rows = not tested) Able to make composite fist but stiffness reported in composite flexion  HAND FUNCTION: 03/28/24 Grip strength: Right: 85 lbs; Left: 72 lbs, Lateral pinch: Right: 22 lbs, Left: 17 lbs, and 3 point pinch: Right: 18 lbs, Left: 14 lbs 04/04/24 Grip strength: Right: 85 lbs; Left: 78 lbs, Lateral pinch: Right: 22 lbs, Left: 16 lbs, and 3 point pinch: Right: 18 lbs, Left: 16 lbs COORDINATION: Impaired thumb immobilized because of EPL repair  SENSATION: Patient report some sensation deficit or numbness in the tip of the thumb  EDEMA: Minimal edema noticed  COGNITION: Overall cognitive status: Within functional limits for tasks assessed        TREATMENT DATE:  04/11/24  Patient has not been wearing any splints.   Active range of motion for thumb  flexion at IP increase  Maintaining extension of IP at -10 Wrist active range of motion strength within normal range  Patient got flex bar 5 pounds for supination pronation as well as wrist flexion extension and radial ulnar deviation in combination with grip using and engaging thumb.   Done 2-3 sets of 15 reps Per pt increase to the next one - doing well    Patient is 10 weeks postop  today.   Paraffin done for moist deep heat prior to scar massage and review of passive range of motion stretches  Thumb MC and IP  stretch while in paraffin  for 8 minutes    Patient continues to have some scar adhesions on the dorsal thumb.  Done some scar massage and mobilization with patient Joint mobs and light traction done to thumb IP and MC in combination with IP and MC felxion Provide patient with new Cica -Care scar pad for nighttime Continue at nighttime for Cica -Care scar pad. Pt to do scar massage 3 x day        Joint mobilization done and gentle traction for thumb MCP IP flexion in combination with passive and extended stretch.  With extension between Extended stretch and composite flexion stretch to thumb MCP IP to base of fifth Followed by placing hold. Small green putty farm pushing into putty at composite flexion at base of fifth 10 reps added to home program Golf ball in Green medium firm putty and then in dark using thumb adduction abduction as well as extension and flexion to retrieve ball 10 reps with each putty added to home program to use with dark blue Nine-hole peg test done patient with right hand 20.65 seconds and left 24.38 seconds Relying more on the lateral pinch on the left than the 3-point pinch Dark blue putty done with patient and added to home program retrieving 1 cm objects out of putty using 2 and 3-point pinch Dark firm blue putty at for patient to do composite gripping.  To home program 15 reps  Continue with medium green putty for pulling and twisting - maintain oval for thumb to fingers  Patient to continue with rubber band for thumb palmar radial abduction 15 reps for IP and MCP      Patient to continue with fine motor and dexterity and opposition using golf ball from palm t ofinger tips 10 reps   Then 2nd and 3rd <> palm l 4th and 5th <> palm 10 reps Three 1 cm objects patient to pick up using 2-point pinch to palm and retrieve using  thumb to fingertips out of palm 5-8 reps Patient can do each session at the end of each session 3-4 times a day      PATIENT EDUCATION: Education details: findings of eval and HEP / splint wearing Person educated: Patient Education method: Explanation, Demonstration, Tactile cues, Verbal cues, and Handouts Education comprehension: verbalized understanding, returned demonstration, verbal cues required, and needs further education      GOALS: Goals reviewed with patient? Yes   SHORT TERM GOALS: Target date: 2 weeks   Patient to tolerate wearing of splint and be independent in donning and doffing of protective static wrist and thumb splint to protect right EPL repair Baseline: Patient arrive soft cast.  Removed and fabricated custom forearm brace wrist and thumb static splint to protect repair Goal status: INITIAL     LONG TERM  GOALS: Target date: 12 weeks      1.  Right wrist active range of motion improved to within normal range in all planes to use in bathing and petting her animals symptom-free Baseline: 13 days postop immobilized Goal status: INITIAL   2.  Right thumb palmar radial abduction improved to within normal limits for patient to be able to grasp a cup and turn doorknob symptom-free Baseline: 13 days postop immobilized to protect repair Goal status: INITIAL   3.  Right thumb flexion and opposition improved to within functional limits for patient to retrieve objects out of palm Baseline: 13 days postop immobilization of thumb Goal status: INITIAL   4.  Right thumb lateral 3-point pinch and grip improve within normal range for her age to be able to do buttons and open Ziploc bags and carry groceries symptom-free Baseline: Not tested Goal status: INITIAL   5.  PRWHE function score improve to less than 10/50 Baseline: 50/50 score for function on PRWHE Goal status: INITIAL    ASSESSMENT:  CLINICAL IMPRESSION: Patient seen today for occupational therapy  follow-up for right EPL repair on 02/01/24.   NOW -patient is 10 weeks postop today.  Patient not wearing any splints.  Cont to monitor for thumb IP extension lag as increasing IP flexion and thumb composite flexion as well as strengthening. -IP extension same than last week.  Reinforced with patient to continue to focus on IP MC flexion and composite flexion to base of fifth after using heat few times during the day followed by active range of motion and strengthening.   Continue with dexterity and strengthening exercises to thumb in all directions -tolerate well -upgrade patient to firm blue putty for gripping, 2.3-point pinch lateral deviation strengthening of thumb.  -Continue with medium firm green putty for lumbrical fist , twisting -patient was able to get flex bar 5 pounds performing wrist and forearm exercises while engaging thumb gripping with strengthening.  Tolerated well.  Patient can perform 15 reps 2-3 times a day and can upgrade to 2nd and 3rd set this week    Continue to focus on scar massage and mobilization.  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  Patient can benefit from skilled OT services to decrease scar tissue increase motion and increase strength following in the [place]  hand protocol.  PERFORMANCE DEFICITS: in functional skills including ADLs, IADLs, ROM, strength, pain, flexibility, decreased knowledge of use of DME, and UE functional use,   and psychosocial skills including environmental adaptation and routines and behaviors.   IMPAIRMENTS: are limiting patient from ADLs, IADLs, rest and sleep, play, leisure, and social participation.   COMORBIDITIES: has no other co-morbidities that affects occupational performance. Patient will benefit from skilled OT to address above impairments and improve overall function.  MODIFICATION OR ASSISTANCE TO COMPLETE EVALUATION: No modification of tasks  or assist necessary to complete an evaluation.  OT OCCUPATIONAL PROFILE AND HISTORY: Problem focused assessment: Including review of records relating to presenting problem.  CLINICAL DECISION MAKING: LOW - limited treatment options, no task modification necessary  REHAB POTENTIAL: Good for goals  EVALUATION COMPLEXITY: Low     PLAN:  OT FREQUENCY: 1-2x/week  OT DURATION: 10 weeks  PLANNED INTERVENTIONS: 97168 OT Re-evaluation, 97535 self care/ADL training, 02889 therapeutic exercise, 97530 therapeutic activity, 97112 neuromuscular re-education, 97140 manual therapy, 97018 paraffin, 02960 fluidotherapy, 97034 contrast bath, 97760 Orthotic Initial, [id number] Orthotic/Prosthetic subsequent, scar  mobilization, passive range of motion, patient/family education, and DME and/or AE instructions    CONSULTED AND AGREED WITH PLAN OF CARE: Patient     Ancel Peters, OTR/L,CLT 04/11/2024, 1:30 PM   "

## 2024-04-18 ENCOUNTER — Ambulatory Visit: Admitting: Occupational Therapy

## 2024-04-18 DIAGNOSIS — M25631 Stiffness of right wrist, not elsewhere classified: Secondary | ICD-10-CM

## 2024-04-18 DIAGNOSIS — M6281 Muscle weakness (generalized): Secondary | ICD-10-CM

## 2024-04-18 DIAGNOSIS — M25641 Stiffness of right hand, not elsewhere classified: Secondary | ICD-10-CM

## 2024-04-18 DIAGNOSIS — L905 Scar conditions and fibrosis of skin: Secondary | ICD-10-CM

## 2024-04-18 NOTE — Therapy (Signed)
 " OUTPATIENT OCCUPATIONAL THERAPY ORTHO TREATMENT  Patient Name: Jordan Moreno MRN: [id number] DOB:Mar 23, 1991, 34 y.o., male Today's Date: 04/18/2024  PCP: No PCP REFERRING PROVIDER: Dr Jordan  END OF SESSION:  OT End of Session - 04/18/24 1550     Visit Number 10    Number of Visits 16    Date for Recertification  04/25/24    OT Start Time 1408    OT Stop Time 1450    OT Time Calculation (min) 42 min    Activity Tolerance Patient tolerated treatment well    Behavior During Therapy Jordan Moreno for tasks assessed/performed          Past Medical History:  Diagnosis Date   Hydrocele    Inguinal hernia    Past Surgical History:  Procedure Laterality Date   INGUINAL HERNIA REPAIR Right 2009   REPAIR EXTENSOR TENDON Left 02/01/2024   Procedure: REPAIR, TENDON, EXTENSOR;  Surgeon: Jordan Jackquline RAMAN, MD;  Location: [facility] OR[facility];  Service: Orthopedics;  Laterality: Left;   There are no active problems to display for this patient.   ONSET DATE: 02/01/24  REFERRING DIAG: R EPL repair  THERAPY DIAG:  Scar condition and fibrosis of skin  Stiffness of right hand, not elsewhere classified  Stiffness of right wrist, not elsewhere classified  Muscle weakness (generalized)  Rationale for Evaluation and Treatment: Rehabilitation  SUBJECTIVE:   SUBJECTIVE STATEMENT: I have been using it normally at home - picking up my 5 months old- and working out at gym -but not forceful things like tools, pull up - just stiff in the am - but strength good  Pt accompanied by: self  PERTINENT HISTORY: 02/14/24: Left upper Extremity: Incision is clean, dry, intact. No significant erythema, drainage, or other signs of infection. Sutures are in place. Sensation is intact to light touch to the radial and ulnar aspects of the thumb. The tip of the thumb is warm and well-perfused with brisk cap refill. I did not ask him to demonstrate range of motion of the thumb or actively extended but he is holding the  thumb extended on examination.  Imaging and Results: NA  Assessment & Plan: Jordan Moreno is a 34 y.o. male patient who is s/p left EPL laceration repair on 02/01/2024 - Sutures removed in clinic today - New thumb spica splint placed today - Referral to occupational therapy for custom orthosis, tendon repair protocol - Follow-up in 4 weeks   PRECAUTIONS: Orthopedic orders- splint on and NWB or use of R hand    WEIGHT BEARING RESTRICTIONS: NWB  PAIN:  Are you having pain?no pain   FALLS: Has patient fallen in last 6 months? 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  Likes to workout in his free time and spend time with his family.  Patient has a 10-week newborn baby  PATIENT GOALS: Want to get this thumb better so that I can get back to work and use it to work out and do things and help with my daughter  NEXT MD VISIT: 03/15/24  OBJECTIVE:  Note: Objective measures were completed at Evaluation unless otherwise noted.  HAND DOMINANCE: Right  ADLs: Unable to use her right hand-mostly thumb with no gripping no pinching or pulling-splint on all the time  FUNCTIONAL OUTCOME MEASURES: PRWHE pain and function   UPPER EXTREMITY ROM:     Active ROM Right eval Left 02/29/24 L 03/07/24 L 03/14/24  Shoulder flexion  Shoulder abduction      Shoulder adduction      Shoulder extension      Shoulder internal rotation      Shoulder external rotation      Elbow flexion      Elbow extension      Wrist flexion  40 75 90  Wrist extension  50 68 80  Wrist ulnar deviation  35 35 35  Wrist radial deviation  25 25 25   Wrist pronation  90 90 90  Wrist supination  90 90 90  (Blank rows = not tested)  Active ROM Right eval Left 02/29/24 L 03/07/24 L 12/30/25L  L 03/28/24 L 04/04/24 L 04/11/24 L 04/18/24  Thumb MCP (0-60)   30 45 40 40 40 35 35 end of session 45  Thumb IP (0-80)  18 20 38/ -15 ext 44/-15  End of  session 50/-15 40 , ext -10  45/ -10 50/-10 50 and end of sesion 55   Thumb Radial abd/add (0-55)  54  60 55 55 55 55    Thumb Palmar abd/add (0-45)  55  60 60 60 60 60    Thumb Opposition to Small Finger    Opposition to all digits with decreased flexion out of MCP IP Opposition to 2nd fold of 5th  Opposition to 2nd base  Opposition to 5th - 2nd fold 2nd to 5th  2nd fold of 5th - in session to base of 5th  Prox phalanges of 5th   Index MCP (0-90)            Index PIP (0-100)            Index DIP (0-70)             Long MCP (0-90)             Long PIP (0-100)             Long DIP (0-70)             Ring MCP (0-90)             Ring PIP (0-100)             Ring DIP (0-70)             Little MCP (0-90)             Little PIP (0-100)             Little DIP (0-70)             (Blank rows = not tested) Able to make composite fist but stiffness reported in composite flexion  HAND FUNCTION: 03/28/24 Grip strength: Right: 85 lbs; Left: 72 lbs, Lateral pinch: Right: 22 lbs, Left: 17 lbs, and 3 point pinch: Right: 18 lbs, Left: 14 lbs 04/04/24 Grip strength: Right: 85 lbs; Left: 78 lbs, Lateral pinch: Right: 22 lbs, Left: 16 lbs, and 3 point pinch: Right: 18 lbs, Left: 16 lbs 04/18/24 Grip strength: Right: 95 lbs; Left: 95 lbs, Lateral pinch: Right: 24 lbs, Left: 19 lbs, and 3 point pinch: Right: 20 lbs, Left: 19 lbs COORDINATION: Impaired thumb immobilized because of EPL repair  SENSATION: Patient report some sensation deficit or numbness in the tip of the thumb  EDEMA: Minimal edema noticed  COGNITION: Overall cognitive status: Within functional limits for tasks assessed        TREATMENT DATE:  04/18/24  Patient report using his hand mostly normally at home. Taking care of his 6-month-old daughter. Not back to doing his plumbing business.  Some light  activities. Recommend gym but no pull-ups or push-ups   Patient continues to show increase AROM and thumb flexion. Great improvement in grip and prehension. See flowsheet. Maintaining extension of IP at -10 Wrist active range of motion strength within normal range  Patient got flex bar 5 and 10 pounds for supination pronation as well as wrist flexion extension and radial ulnar deviation in combination with grip using and engaging thumb.   Doing 2-3 sets of 12-15.   Patient is 11 weeks postop today.   Paraffin done for moist deep heat-[place] flexion wrap for thumb MCP IP  prior to scar massage and review of passive range of motion stretches  Thumb MC and IP  stretch while in paraffin  for 8 minutes   Patient able to carry on left 8 to 15 pounds.  With left hand.  No issues Simulate some chest press that he can do in the gym with 25 pound bar and 45 pound bar No issues   Patient continues to have some scar adhesions on the dorsal thumb.  Done some scar massage and mobilization with patient Joint mobs and light traction done to thumb IP and MC in combination with IP and MC felxion Provide patient with new Cica -Care scar pad for nighttime Continue at nighttime for Cica -Care scar pad. Pt to do scar massage 3 x day    Patient continues to show increased thumb IP MC flexion at end of session.   As well as opposition to proximal phalanges of the fifth   Patient to continue with same strengthening exercises  See below  small green putty farm pushing into putty at composite flexion at base of fifth 10 reps added to home program Golf ball in Green medium firm putty and then in dark using thumb adduction abduction as well as extension and flexion to retrieve ball 10 reps with each putty added to home program to use with dark blue Nine-hole peg test done patient with right hand 20.65 seconds and left 24.38 seconds Relying more on the lateral pinch on the left than the 3-point pinch Dark  blue putty done with patient and added to home program retrieving 1 cm objects out of putty using 2 and 3-point pinch Dark firm blue putty at for patient to do composite gripping.  To home program 15 reps  Continue with medium green putty for pulling and twisting - maintain oval for thumb to fingers  Patient to continue with rubber band for thumb palmar radial abduction 15 reps for IP and MCP        PATIENT EDUCATION: Education details: findings of eval and HEP / splint wearing Person educated: Patient Education method: Explanation, Demonstration, Tactile cues, Verbal cues, and Handouts Education comprehension: verbalized understanding, returned demonstration, verbal cues required, and needs further education      GOALS: Goals reviewed with patient? Yes   SHORT TERM GOALS: Target date: 2 weeks   Patient to tolerate wearing of splint and be independent in donning and doffing of protective static wrist and thumb splint to protect right EPL repair Baseline: Patient arrive soft cast.  Removed and fabricated custom forearm brace wrist and thumb static splint to protect repair Goal status: INITIAL     LONG TERM GOALS: Target date: 12 weeks      1.  Right wrist active range of  motion improved to within normal range in all planes to use in bathing and petting her animals symptom-free Baseline: 13 days postop immobilized Goal status: INITIAL   2.  Right thumb palmar radial abduction improved to within normal limits for patient to be able to grasp a cup and turn doorknob symptom-free Baseline: 13 days postop immobilized to protect repair Goal status: INITIAL   3.  Right thumb flexion and opposition improved to within functional limits for patient to retrieve objects out of palm Baseline: 13 days postop immobilization of thumb Goal status: INITIAL   4.  Right thumb lateral 3-point pinch and grip improve within normal range for her age to be able to do buttons and open Ziploc bags  and carry groceries symptom-free Baseline: Not tested Goal status: INITIAL   5.  PRWHE function score improve to less than 10/50 Baseline: 50/50 score for function on PRWHE Goal status: INITIAL    ASSESSMENT:  CLINICAL IMPRESSION: Patient seen today for occupational therapy follow-up for right EPL repair on 02/01/24.   NOW 10th visit -patient is 11 weeks postop today.  Patient not wearing any splints. Pt able to maintain thumb IP  extension as increasing IP flexion and thumb composite flexion as well as strengthening Reinforced with patient to continue to focus on IP MC flexion and composite flexion to base of fifth after using heat few times during the day followed by active range of motion and strengthening.   Continue with dexterity and strengthening exercises to thumb in all directions -tolerate well -Pt to to cont with firm blue putty for gripping, 2.3-point pinch lateral deviation strengthening of thumb.  -Continue with medium firm green putty for lumbrical fist , twisting -patient was able to get flex bar 5 - 10 pounds performing wrist and forearm exercises while engaging thumb gripping with strengthening.  Tolerated well.  Patient can perform 15 reps 2-3 times a day and can upgrade to 2nd and 3rd set this week - today simulated some increase weight - chest press 25 to 45 lbs .    Continue to focus on scar massage and mobilization.  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  Patient can benefit from skilled OT services to decrease scar tissue increase motion and increase strength following in the [place]  hand protocol.  PERFORMANCE DEFICITS: in functional skills including ADLs, IADLs, ROM, strength, pain, flexibility, decreased knowledge of use of DME, and UE functional use,   and psychosocial skills including environmental adaptation and routines and behaviors.   IMPAIRMENTS: are limiting patient from  ADLs, IADLs, rest and sleep, play, leisure, and social participation.   COMORBIDITIES: has no other co-morbidities that affects occupational performance. Patient will benefit from skilled OT to address above impairments and improve overall function.  MODIFICATION OR ASSISTANCE TO COMPLETE EVALUATION: No modification of tasks or assist necessary to complete an evaluation.  OT OCCUPATIONAL PROFILE AND HISTORY: Problem focused assessment: Including review of records relating to presenting problem.  CLINICAL DECISION MAKING: LOW - limited treatment options, no task modification necessary  REHAB POTENTIAL: Good for goals  EVALUATION COMPLEXITY: Low     PLAN:  OT FREQUENCY: 1-2x/week  OT DURATION: 10 weeks  PLANNED INTERVENTIONS: 97168 OT Re-evaluation, 97535 self care/ADL training, 02889 therapeutic exercise, 97530 therapeutic activity, 97112 neuromuscular re-education, 97140 manual therapy, 97018 paraffin, 02960 fluidotherapy, 97034 contrast bath, 97760 Orthotic Initial, [id number] Orthotic/Prosthetic subsequent, scar mobilization, passive range of motion,  patient/family education, and DME and/or AE instructions    CONSULTED AND AGREED WITH PLAN OF CARE: Patient     Jordan Moreno, OTR/L,CLT 04/18/2024, 3:52 PM   "

## 2024-04-25 ENCOUNTER — Ambulatory Visit: Admitting: Occupational Therapy

## 2024-04-25 DIAGNOSIS — L905 Scar conditions and fibrosis of skin: Secondary | ICD-10-CM

## 2024-04-25 DIAGNOSIS — M6281 Muscle weakness (generalized): Secondary | ICD-10-CM

## 2024-04-25 DIAGNOSIS — M25641 Stiffness of right hand, not elsewhere classified: Secondary | ICD-10-CM

## 2024-04-25 DIAGNOSIS — M25631 Stiffness of right wrist, not elsewhere classified: Secondary | ICD-10-CM

## 2024-05-02 ENCOUNTER — Ambulatory Visit: Admitting: Occupational Therapy

## 2024-05-09 ENCOUNTER — Ambulatory Visit: Admitting: Occupational Therapy

## 2024-05-16 ENCOUNTER — Ambulatory Visit: Admitting: Occupational Therapy
